# Patient Record
Sex: Male | Born: 1990 | Race: Black or African American | Hispanic: No | Marital: Married | Smoking: Former smoker
Health system: Southern US, Community
[De-identification: ages and names within clinical notes are randomized; demographics above are authoritative.]

---

## 2012-09-03 ENCOUNTER — Encounter (HOSPITAL_COMMUNITY): Payer: Self-pay

## 2012-09-03 ENCOUNTER — Emergency Department (INDEPENDENT_AMBULATORY_CARE_PROVIDER_SITE_OTHER)
Admission: EM | Admit: 2012-09-03 | Discharge: 2012-09-03 | Disposition: A | Discharge status: Home / Self Care | Payer: Medicaid Other | Source: Home / Self Care | Attending: Family Medicine | Admitting: Family Medicine

## 2012-09-03 DIAGNOSIS — H109 Unspecified conjunctivitis: Secondary | ICD-10-CM

## 2012-09-03 MED ORDER — TOBRAMYCIN 0.3 % OP SOLN
2.0000 [drp] | Freq: Four times a day (QID) | OPHTHALMIC | Status: DC
  Administered 2012-09-03 (×2): 2 [drp] via OPHTHALMIC

## 2012-09-03 MED ORDER — TOBRAMYCIN 0.3 % OP SOLN
OPHTHALMIC | Status: AC
  Filled 2012-09-03: qty 5

## 2012-09-03 NOTE — ED Notes (Signed)
Patient has 3 day duration of temporal area HA

## 2012-09-03 NOTE — ED Provider Notes (Signed)
   History    CSN: [id number] Arrival date & time 09/03/12  1514  First MD Initiated Contact with Patient 09/03/12 1635     Chief Complaint  Patient presents with  . Headache   (Consider location/radiation/quality/duration/timing/severity/associated sxs/prior Treatment) Patient is a 22 y.o. male presenting with conjunctivitis. The history is provided by the patient. The history is limited by a language barrier. A language interpreter was used.  Conjunctivitis This is a new problem. The current episode started more than 2 days ago.   History reviewed. No pertinent past medical history. History reviewed. No pertinent past surgical history. History reviewed. No pertinent family history. History  Substance Use Topics  . Smoking status: Not on file  . Smokeless tobacco: Not on file  . Alcohol Use: Not on file    Review of Systems  Constitutional: Negative.   HENT: Negative.   Eyes: Positive for redness and itching. Negative for photophobia.    Allergies  Review of patient's allergies indicates not on file.  Home Medications  No current outpatient prescriptions on file. BP 125/76  Pulse 63  Temp(Src) 98.1 F (36.7 C) (Oral)  Resp 10  SpO2 100% Physical Exam  Nursing note and vitals reviewed. Constitutional: He is oriented to person, place, and time. He appears well-developed and well-nourished.  HENT:  Head: Normocephalic.  Right Ear: External ear normal.  Left Ear: External ear normal.  Nose: Nose normal.  Mouth/Throat: Oropharynx is clear and moist.  Eyes: Conjunctivae and EOM are normal. Pupils are equal, round, and reactive to light.  Neck: Normal range of motion. Neck supple.  Lymphadenopathy:    He has no cervical adenopathy.  Neurological: He is alert and oriented to person, place, and time.  Skin: Skin is warm and dry.    ED Course  Procedures (including critical care time) Labs Reviewed - No data to display No results found. 1. Conjunctivitis of left  eye     MDM    Linna Hoff, MD 09/03/12 1659

## 2012-09-06 ENCOUNTER — Emergency Department (INDEPENDENT_AMBULATORY_CARE_PROVIDER_SITE_OTHER)
Admission: EM | Admit: 2012-09-06 | Discharge: 2012-09-06 | Disposition: A | Discharge status: Home / Self Care | Payer: Medicaid Other | Source: Home / Self Care

## 2012-09-06 ENCOUNTER — Encounter (HOSPITAL_COMMUNITY): Payer: Self-pay | Admitting: Emergency Medicine

## 2012-09-06 DIAGNOSIS — S0502XD Injury of conjunctiva and corneal abrasion without foreign body, left eye, subsequent encounter: Secondary | ICD-10-CM

## 2012-09-06 DIAGNOSIS — A499 Bacterial infection, unspecified: Secondary | ICD-10-CM

## 2012-09-06 DIAGNOSIS — H109 Unspecified conjunctivitis: Secondary | ICD-10-CM

## 2012-09-06 DIAGNOSIS — H1089 Other conjunctivitis: Secondary | ICD-10-CM

## 2012-09-06 DIAGNOSIS — Z5189 Encounter for other specified aftercare: Secondary | ICD-10-CM

## 2012-09-06 MED ORDER — POLYMYXIN B-TRIMETHOPRIM 10000-0.1 UNIT/ML-% OP SOLN: 2.0000 [drp] | OPHTHALMIC | Status: DC

## 2012-09-06 MED ORDER — AMOXICILLIN-POT CLAVULANATE 875-125 MG PO TABS: 1.0000 | ORAL_TABLET | Freq: Two times a day (BID) | ORAL | Status: DC

## 2012-09-06 MED ORDER — TETRACAINE HCL 0.5 % OP SOLN
OPHTHALMIC | Status: AC
  Filled 2012-09-06: qty 2

## 2012-09-06 NOTE — ED Provider Notes (Signed)
Medical screening examination/treatment/procedure(s) were performed by a resident physician or non-physician practitioner and as the supervising physician I was immediately available for consultation/collaboration.  Quinterious Walraven, MD   Nash Bolls S Mayme Profeta, MD 09/06/12 1752 

## 2012-09-06 NOTE — ED Notes (Signed)
Patient reports being seen 09/03/2012, diagnosed with conjunctivitis, prescribed eye drops.  Reports he he is no better and is worse.

## 2012-09-06 NOTE — ED Notes (Signed)
Eye box and tetracaine at bedside 

## 2012-09-06 NOTE — ED Provider Notes (Signed)
   History    CSN: [id number] Arrival date & time 09/06/12  1124  First MD Initiated Contact with Patient 09/06/12 1156     Chief Complaint  Patient presents with  . Conjunctivitis   (Consider location/radiation/quality/duration/timing/severity/associated sxs/prior Treatment) HPI Comments: 22 year old non-English-speaking immigrant denies states was seen here in the urgent care Monday for left eye redness. He was treated with TobraDex drops. He said his left skin worse and becoming more painful having more drainage and swelling and redness.  History reviewed. No pertinent past medical history. History reviewed. No pertinent past surgical history. No family history on file. History  Substance Use Topics  . Smoking status: Current Every Day Smoker  . Smokeless tobacco: Not on file  . Alcohol Use: Yes    Review of Systems  Eyes: Positive for pain, discharge, redness and visual disturbance.  All other systems reviewed and are negative.    Allergies  Review of patient's allergies indicates no known allergies.  Home Medications   Current Outpatient Rx  Name  Route  Sig  Dispense  Refill  . amoxicillin-clavulanate (AUGMENTIN) 875-125 MG per tablet   Oral   Take 1 tablet by mouth every 12 (twelve) hours.   14 tablet   0   . trimethoprim-polymyxin b (POLYTRIM) ophthalmic solution   Left Eye   Place 2 drops into the left eye every 4 (four) hours.   10 mL   0    BP 127/82  Pulse 62  Temp(Src) 98.2 F (36.8 C) (Oral)  Resp 14  SpO2 100% Physical Exam  Nursing note and vitals reviewed. Constitutional: He is oriented to person, place, and time. He appears well-developed and well-nourished. No distress.  HENT:  Right Ear: External ear normal.  Left Ear: External ear normal.  Eyes: EOM are normal. Pupils are equal, round, and reactive to light.  The conjunctiva of the lower lid with erythema swelling. Sclera injected.  Tetracaine drops placed the left eye followed by  forcing stain. There is slight uptake over the mid cornea and lower mid sclera. I been irrigated with 3 ounces of mouthwash.  Neck: Normal range of motion. Neck supple.  Pulmonary/Chest: Effort normal.  Lymphadenopathy:    He has no cervical adenopathy.  Neurological: He is alert and oriented to person, place, and time.  Skin: Skin is warm and dry.  Psychiatric: He has a normal mood and affect.    ED Course  Procedures (including critical care time) Labs Reviewed - No data to display No results found. 1. Bacterial conjunctivitis of left eye   2. Corneal abrasion, left, subsequent encounter     MDM   stop the Tobrex Start Polytrim 2 drops to left eye 4 times a day Augmentin 875 twice a day Probably above ophthalmologist for an appointment to see in 2 days. 3 new symptoms problems or worsening may return.  Hayden Rasmussen, NP 09/06/12 1303

## 2012-09-12 HISTORY — PX: APPENDECTOMY: SHX54

## 2012-09-13 ENCOUNTER — Observation Stay (HOSPITAL_COMMUNITY)
Admission: EM | Admit: 2012-09-13 | Discharge: 2012-09-14 | Disposition: A | Discharge status: Home / Self Care | Payer: Medicaid Other | Attending: General Surgery | Admitting: General Surgery

## 2012-09-13 ENCOUNTER — Encounter (HOSPITAL_COMMUNITY): Payer: Self-pay | Admitting: Anesthesiology

## 2012-09-13 ENCOUNTER — Emergency Department (HOSPITAL_COMMUNITY): Payer: Medicaid Other

## 2012-09-13 ENCOUNTER — Observation Stay (HOSPITAL_COMMUNITY): Payer: Medicaid Other | Admitting: Anesthesiology

## 2012-09-13 ENCOUNTER — Encounter (HOSPITAL_COMMUNITY)
Admission: EM | Disposition: A | Discharge status: Home / Self Care | Payer: Self-pay | Source: Home / Self Care | Attending: Emergency Medicine

## 2012-09-13 ENCOUNTER — Encounter (HOSPITAL_COMMUNITY): Payer: Self-pay | Admitting: Emergency Medicine

## 2012-09-13 DIAGNOSIS — K37 Unspecified appendicitis: Secondary | ICD-10-CM

## 2012-09-13 DIAGNOSIS — K3580 Unspecified acute appendicitis: Secondary | ICD-10-CM

## 2012-09-13 DIAGNOSIS — R1031 Right lower quadrant pain: Secondary | ICD-10-CM | POA: Insufficient documentation

## 2012-09-13 HISTORY — PX: LAPAROSCOPIC APPENDECTOMY: SHX408

## 2012-09-13 LAB — URINALYSIS, ROUTINE W REFLEX MICROSCOPIC
Glucose, UA: NEGATIVE mg/dL
Hgb urine dipstick: NEGATIVE
Protein, ur: 30 mg/dL — AB
pH: 7.5 (ref 5.0–8.0)

## 2012-09-13 LAB — COMPREHENSIVE METABOLIC PANEL
ALT: 33 U/L (ref 0–53)
AST: 22 U/L (ref 0–37)
Albumin: 4.3 g/dL (ref 3.5–5.2)
CO2: 29 mEq/L (ref 19–32)
Calcium: 9.6 mg/dL (ref 8.4–10.5)
Chloride: 99 mEq/L (ref 96–112)
GFR calc non Af Amer: >90 mL/min (ref >90)
Sodium: 136 mEq/L (ref 135–145)

## 2012-09-13 LAB — URINE MICROSCOPIC-ADD ON

## 2012-09-13 LAB — CBC WITH DIFFERENTIAL/PLATELET
Basophils Absolute: 0 10*3/uL (ref 0.0–0.1)
Eosinophils Relative: 2 % (ref 0–5)
Lymphocytes Relative: 9 % — ABNORMAL LOW (ref 12–46)
Neutro Abs: 10.3 10*3/uL — ABNORMAL HIGH (ref 1.7–7.7)
Platelets: 134 10*3/uL — ABNORMAL LOW (ref 150–400)
RDW: 12.2 % (ref 11.5–15.5)
WBC: 12.6 10*3/uL — ABNORMAL HIGH (ref 4.0–10.5)

## 2012-09-13 SURGERY — APPENDECTOMY, LAPAROSCOPIC
Anesthesia: General | Site: Abdomen | Wound class: Contaminated

## 2012-09-13 MED ORDER — PROPOFOL 10 MG/ML IV BOLUS
INTRAVENOUS | Status: DC | PRN
  Administered 2012-09-13: 150 mg via INTRAVENOUS

## 2012-09-13 MED ORDER — HYDROCODONE-ACETAMINOPHEN 5-325 MG PO TABS
1.0000 | ORAL_TABLET | ORAL | Status: DC | PRN
  Administered 2012-09-13 (×2): 1 via ORAL
  Administered 2012-09-14: 2 via ORAL
  Filled 2012-09-13 (×2): qty 2

## 2012-09-13 MED ORDER — BUPIVACAINE-EPINEPHRINE 0.25% -1:200000 IJ SOLN
INTRAMUSCULAR | Status: DC | PRN
  Administered 2012-09-13: 10 mL

## 2012-09-13 MED ORDER — TOBRAMYCIN 0.3 % OP SOLN
1.0000 [drp] | OPHTHALMIC | Status: DC
  Administered 2012-09-13 – 2012-09-14 (×4): 1 [drp] via OPHTHALMIC
  Filled 2012-09-13: qty 5

## 2012-09-13 MED ORDER — NEOSTIGMINE METHYLSULFATE 1 MG/ML IJ SOLN
INTRAMUSCULAR | Status: DC | PRN
  Administered 2012-09-13: 1 mg via INTRAVENOUS
  Administered 2012-09-13: 3 mg via INTRAVENOUS

## 2012-09-13 MED ORDER — HYDROMORPHONE HCL PF 1 MG/ML IJ SOLN
0.2500 mg | INTRAMUSCULAR | Status: DC | PRN
  Administered 2012-09-13: 0.5 mg via INTRAVENOUS
  Filled 2012-09-13: qty 1

## 2012-09-13 MED ORDER — MORPHINE SULFATE 2 MG/ML IJ SOLN
2.0000 mg | INTRAMUSCULAR | Status: DC | PRN
  Filled 2012-09-13: qty 1

## 2012-09-13 MED ORDER — IOHEXOL 300 MG/ML  SOLN
25.0000 mL | INTRAMUSCULAR | Status: AC
  Administered 2012-09-13: 25 mL via ORAL

## 2012-09-13 MED ORDER — POLYMYXIN B-TRIMETHOPRIM 10000-0.1 UNIT/ML-% OP SOLN
2.0000 [drp] | OPHTHALMIC | Status: DC
  Administered 2012-09-13 – 2012-09-14 (×4): 2 [drp] via OPHTHALMIC
  Filled 2012-09-13: qty 10

## 2012-09-13 MED ORDER — 0.9 % SODIUM CHLORIDE (POUR BTL) OPTIME
TOPICAL | Status: DC | PRN
  Administered 2012-09-13: 1000 mL

## 2012-09-13 MED ORDER — IOHEXOL 300 MG/ML  SOLN
100.0000 mL | Freq: Once | INTRAMUSCULAR | Status: AC | PRN
  Administered 2012-09-13: 100 mL via INTRAVENOUS

## 2012-09-13 MED ORDER — ONDANSETRON HCL 4 MG/2ML IJ SOLN: 4.0000 mg | Freq: Once | INTRAMUSCULAR | Status: AC | PRN

## 2012-09-13 MED ORDER — KETOROLAC TROMETHAMINE 30 MG/ML IJ SOLN: 15.0000 mg | Freq: Once | INTRAMUSCULAR | Status: AC | PRN

## 2012-09-13 MED ORDER — GLYCOPYRROLATE 0.2 MG/ML IJ SOLN
INTRAMUSCULAR | Status: DC | PRN
  Administered 2012-09-13: 0.2 mg via INTRAVENOUS
  Administered 2012-09-13: 0.4 mg via INTRAVENOUS

## 2012-09-13 MED ORDER — MIDAZOLAM HCL 5 MG/5ML IJ SOLN
INTRAMUSCULAR | Status: DC | PRN
  Administered 2012-09-13: 2 mg via INTRAVENOUS

## 2012-09-13 MED ORDER — ONDANSETRON HCL 4 MG/2ML IJ SOLN
INTRAMUSCULAR | Status: DC | PRN
  Administered 2012-09-13: 4 mg via INTRAVENOUS

## 2012-09-13 MED ORDER — CHLORHEXIDINE GLUCONATE CLOTH 2 % EX PADS
6.0000 | MEDICATED_PAD | Freq: Every day | CUTANEOUS | Status: DC
  Administered 2012-09-14: 6 via TOPICAL

## 2012-09-13 MED ORDER — LACTATED RINGERS IV SOLN
INTRAVENOUS | Status: DC | PRN
  Administered 2012-09-13: 11:00:00 via INTRAVENOUS

## 2012-09-13 MED ORDER — POLYMYXIN B-TRIMETHOPRIM 10000-0.1 UNIT/ML-% OP SOLN: 2.0000 [drp] | OPHTHALMIC | Status: DC

## 2012-09-13 MED ORDER — LIDOCAINE HCL (CARDIAC) 20 MG/ML IV SOLN
INTRAVENOUS | Status: DC | PRN
  Administered 2012-09-13: 30 mg via INTRAVENOUS

## 2012-09-13 MED ORDER — ONDANSETRON HCL 4 MG/2ML IJ SOLN
4.0000 mg | Freq: Once | INTRAMUSCULAR | Status: AC
  Administered 2012-09-13: 4 mg via INTRAVENOUS
  Filled 2012-09-13: qty 2

## 2012-09-13 MED ORDER — KCL IN DEXTROSE-NACL 20-5-0.45 MEQ/L-%-% IV SOLN
INTRAVENOUS | Status: DC
  Administered 2012-09-13: 10:00:00 via INTRAVENOUS
  Filled 2012-09-13 (×7): qty 1000

## 2012-09-13 MED ORDER — SODIUM CHLORIDE 0.9 % IV SOLN
1.0000 g | INTRAVENOUS | Status: AC
  Administered 2012-09-13: 1 g via INTRAVENOUS
  Filled 2012-09-13: qty 1

## 2012-09-13 MED ORDER — HYDROMORPHONE HCL PF 1 MG/ML IJ SOLN
1.0000 mg | Freq: Once | INTRAMUSCULAR | Status: AC
  Administered 2012-09-13: 1 mg via INTRAVENOUS
  Filled 2012-09-13: qty 1

## 2012-09-13 MED ORDER — ROCURONIUM BROMIDE 100 MG/10ML IV SOLN
INTRAVENOUS | Status: DC | PRN
  Administered 2012-09-13: 40 mg via INTRAVENOUS

## 2012-09-13 MED ORDER — FENTANYL CITRATE 0.05 MG/ML IJ SOLN
INTRAMUSCULAR | Status: DC | PRN
  Administered 2012-09-13: 50 ug via INTRAVENOUS

## 2012-09-13 MED ORDER — SODIUM CHLORIDE 0.9 % IR SOLN
Status: DC | PRN
  Administered 2012-09-13: 1000 mL

## 2012-09-13 MED ORDER — ACETAMINOPHEN 325 MG PO TABS: 650.0000 mg | ORAL_TABLET | Freq: Four times a day (QID) | ORAL | Status: DC | PRN

## 2012-09-13 MED ORDER — ACETAMINOPHEN 650 MG RE SUPP: 650.0000 mg | Freq: Four times a day (QID) | RECTAL | Status: DC | PRN

## 2012-09-13 MED ORDER — BUPIVACAINE-EPINEPHRINE PF 0.25-1:200000 % IJ SOLN
INTRAMUSCULAR | Status: AC
  Filled 2012-09-13: qty 30

## 2012-09-13 MED ORDER — ENOXAPARIN SODIUM 40 MG/0.4ML ~~LOC~~ SOLN
40.0000 mg | SUBCUTANEOUS | Status: DC
  Administered 2012-09-13: 40 mg via SUBCUTANEOUS
  Filled 2012-09-13 (×3): qty 0.4

## 2012-09-13 MED ORDER — BACITRACIN ZINC 500 UNIT/GM EX OINT
TOPICAL_OINTMENT | Freq: Two times a day (BID) | CUTANEOUS | Status: DC
  Administered 2012-09-13 – 2012-09-14 (×2): 15.5556 via TOPICAL
  Filled 2012-09-13: qty 15
  Filled 2012-09-13: qty 28.35

## 2012-09-13 SURGICAL SUPPLY — 47 items
APPLIER CLIP ROT 10 11.4 M/L (STAPLE)
BLADE SURG ROTATE 9660 (MISCELLANEOUS) ×2 IMPLANT
CANISTER SUCTION 2500CC (MISCELLANEOUS) ×2 IMPLANT
CHLORAPREP W/TINT 26ML (MISCELLANEOUS) ×2 IMPLANT
CLIP APPLIE ROT 10 11.4 M/L (STAPLE) IMPLANT
CLOTH BEACON ORANGE TIMEOUT ST (SAFETY) ×2 IMPLANT
COVER SURGICAL LIGHT HANDLE (MISCELLANEOUS) ×2 IMPLANT
CUTTER LINEAR ENDO 35 ETS (STAPLE) IMPLANT
CUTTER LINEAR ENDO 35 ETS TH (STAPLE) ×2 IMPLANT
DECANTER SPIKE VIAL GLASS SM (MISCELLANEOUS) IMPLANT
DERMABOND ADVANCED (GAUZE/BANDAGES/DRESSINGS) ×1
DERMABOND ADVANCED .7 DNX12 (GAUZE/BANDAGES/DRESSINGS) ×1 IMPLANT
DRAPE UTILITY 15X26 W/TAPE STR (DRAPE) ×4 IMPLANT
ELECT REM PT RETURN 9FT ADLT (ELECTROSURGICAL) ×2
ELECTRODE REM PT RTRN 9FT ADLT (ELECTROSURGICAL) ×1 IMPLANT
ENDOLOOP SUT PDS II  0 18 (SUTURE)
ENDOLOOP SUT PDS II 0 18 (SUTURE) IMPLANT
GLOVE BIO SURGEON STRL SZ8 (GLOVE) ×2 IMPLANT
GLOVE BIOGEL PI IND STRL 7.0 (GLOVE) ×3 IMPLANT
GLOVE BIOGEL PI IND STRL 8 (GLOVE) ×1 IMPLANT
GLOVE BIOGEL PI INDICATOR 7.0 (GLOVE) ×3
GLOVE BIOGEL PI INDICATOR 8 (GLOVE) ×1
GLOVE SURG SS PI 7.0 STRL IVOR (GLOVE) ×6 IMPLANT
GOWN STRL NON-REIN LRG LVL3 (GOWN DISPOSABLE) ×6 IMPLANT
GOWN STRL REIN XL XLG (GOWN DISPOSABLE) ×2 IMPLANT
KIT BASIN OR (CUSTOM PROCEDURE TRAY) ×2 IMPLANT
KIT ROOM TURNOVER OR (KITS) ×2 IMPLANT
NEEDLE 22X1 1/2 (OR ONLY) (NEEDLE) ×2 IMPLANT
NS IRRIG 1000ML POUR BTL (IV SOLUTION) ×2 IMPLANT
PAD ARMBOARD 7.5X6 YLW CONV (MISCELLANEOUS) ×4 IMPLANT
POUCH SPECIMEN RETRIEVAL 10MM (ENDOMECHANICALS) ×2 IMPLANT
RELOAD /EVU35 (ENDOMECHANICALS) IMPLANT
RELOAD CUTTER ETS 35MM STAND (ENDOMECHANICALS) IMPLANT
SCALPEL HARMONIC ACE (MISCELLANEOUS) ×2 IMPLANT
SET IRRIG TUBING LAPAROSCOPIC (IRRIGATION / IRRIGATOR) ×2 IMPLANT
SPECIMEN JAR SMALL (MISCELLANEOUS) ×2 IMPLANT
SUT VIC AB 4-0 PS2 27 (SUTURE) ×2 IMPLANT
SWAB COLLECTION DEVICE MRSA (MISCELLANEOUS) IMPLANT
TOWEL OR 17X24 6PK STRL BLUE (TOWEL DISPOSABLE) ×2 IMPLANT
TOWEL OR 17X26 10 PK STRL BLUE (TOWEL DISPOSABLE) ×2 IMPLANT
TRAY FOLEY CATH 16FR SILVER (SET/KITS/TRAYS/PACK) ×2 IMPLANT
TRAY LAPAROSCOPIC (CUSTOM PROCEDURE TRAY) ×2 IMPLANT
TROCAR XCEL 12X100 BLDLESS (ENDOMECHANICALS) ×2 IMPLANT
TROCAR XCEL BLUNT TIP 100MML (ENDOMECHANICALS) ×2 IMPLANT
TROCAR XCEL NON-BLD 5MMX100MML (ENDOMECHANICALS) ×2 IMPLANT
TUBE ANAEROBIC SPECIMEN COL (MISCELLANEOUS) IMPLANT
WATER STERILE IRR 1000ML POUR (IV SOLUTION) IMPLANT

## 2012-09-13 NOTE — H&P (Signed)
Early acute appendicitis - will take to OR for lap appy. Procedure, risks, and benefits D/W him and he agrees.  IV Invanz. Patient examined and I agree with the assessment and plan  Violeta Gelinas, MD, MPH, FACS Pager: [phone]  09/13/2012 8:31 AM

## 2012-09-13 NOTE — ED Notes (Addendum)
PT. REPORTS MID ABDOMINAL PAIN WITH DIARRHEA ONSET YESTERDAY AFTERNOON , DENIES NAUSEA OR VOMITTING , NO FEVER OR CHILLS. SLIGHT HEADACHE. FRIEND TRANSLATING Sean Ingram) FOR PT.

## 2012-09-13 NOTE — Transfer of Care (Signed)
Immediate Anesthesia Transfer of Care Note  Patient: Sean Ingram  Procedure(s) Performed: Procedure(s): APPENDECTOMY LAPAROSCOPIC (N/A)  Patient Location: PACU  Anesthesia Type:General  Level of Consciousness: awake, alert  and oriented  Airway & Oxygen Therapy: Patient Spontanous Breathing and Patient connected to nasal cannula oxygen  Post-op Assessment: Report given to PACU RN, Post -op Vital signs reviewed and stable and Patient moving all extremities X 4  Post vital signs: Reviewed and stable  Complications: No apparent anesthesia complications

## 2012-09-13 NOTE — Anesthesia Procedure Notes (Signed)
Procedure Name: Intubation Date/Time: 09/13/2012 11:52 AM Performed by: Quentin Ore Pre-anesthesia Checklist: Patient identified, Emergency Drugs available, Suction available, Patient being monitored and Timeout performed Patient Re-evaluated:Patient Re-evaluated prior to inductionOxygen Delivery Method: Circle system utilized Preoxygenation: Pre-oxygenation with 100% oxygen Intubation Type: IV induction and Cricoid Pressure applied Ventilation: Mask ventilation without difficulty Laryngoscope Size: Mac and 4 Grade View: Grade I Tube type: Oral Tube size: 7.5 mm Number of attempts: 1 Airway Equipment and Method: Stylet and LTA kit utilized Placement Confirmation: ETT inserted through vocal cords under direct vision,  positive ETCO2 and breath sounds checked- equal and bilateral Secured at: 21 cm Tube secured with: Tape Dental Injury: Teeth and Oropharynx as per pre-operative assessment

## 2012-09-13 NOTE — H&P (Signed)
Chief Complaint: abdominal pain HPI: Sean Ingram is a healthy African male who presented to [facility] ED with abdominal pain which suddenly started yesterday at 5PM after eating eggs and potatoes.  Location is generalized.  Associated symptoms; chills.  Coarse is unchanged.  Moderate in severity.  Characterized as "cutting" pain.  No aggravating or alleviating factors. No modifying factors.  Denies fever or sweats.  Denies nausea or vomiting. Denies previous surgical procedures.  At present time pain is 3/10 after 2 doses of dilaudid and 1 dose of morphine.  He is from [place], moved here 3 weeks ago.  He lives with a roommate who is serving as a Nurse, learning disability.  Mr. Haymer declined to have an Arabic interpreter via telephone.  History reviewed. No pertinent past medical history.  History reviewed. No pertinent past surgical history.  History reviewed. No pertinent family history. Social History:  reports that he has been smoking.  He does not have any smokeless tobacco history on file. He reports that  drinks alcohol. He reports that he does not use illicit drugs.  Allergies: No Known Allergies   (Not in a hospital admission)  Results for orders placed during the hospital encounter of 09/13/12 (from the past 48 hour(s))  CBC WITH DIFFERENTIAL     Status: Abnormal   Collection Time    09/13/12  1:07 AM      Result Value Range   WBC 12.6 (*) 4.0 - 10.5 K/uL   RBC 4.89  4.22 - 5.81 MIL/uL   Hemoglobin 15.3  13.0 - 17.0 g/dL   HCT 96.0  45.4 - 09.8 %   MCV 84.9  78.0 - 100.0 fL   MCH 31.3  26.0 - 34.0 pg   MCHC 36.9 (*) 30.0 - 36.0 g/dL   RDW 11.9  14.7 - 82.9 %   Platelets 134 (*) 150 - 400 K/uL   Neutrophils Relative % 82 (*) 43 - 77 %   Neutro Abs 10.3 (*) 1.7 - 7.7 K/uL   Lymphocytes Relative 9 (*) 12 - 46 %   Lymphs Abs 1.1  0.7 - 4.0 K/uL   Monocytes Relative 7  3 - 12 %   Monocytes Absolute 0.9  0.1 - 1.0 K/uL   Eosinophils Relative 2  0 - 5 %   Eosinophils Absolute 0.2  0.0 -  0.7 K/uL   Basophils Relative 0  0 - 1 %   Basophils Absolute 0.0  0.0 - 0.1 K/uL  COMPREHENSIVE METABOLIC PANEL     Status: Abnormal   Collection Time    09/13/12  1:07 AM      Result Value Range   Sodium 136  135 - 145 mEq/L   Potassium 3.5  3.5 - 5.1 mEq/L   Chloride 99  96 - 112 mEq/L   CO2 29  19 - 32 mEq/L   Glucose, Bld 108 (*) 70 - 99 mg/dL   BUN 12  6 - 23 mg/dL   Creatinine, Ser 5.62  0.50 - 1.35 mg/dL   Calcium 9.6  8.4 - 13.0 mg/dL   Total Protein 7.2  6.0 - 8.3 g/dL   Albumin 4.3  3.5 - 5.2 g/dL   AST 22  0 - 37 U/L   ALT 33  0 - 53 U/L   Alkaline Phosphatase 56  39 - 117 U/L   Total Bilirubin 0.7  0.3 - 1.2 mg/dL   GFR calc non Af Amer >90  >90 mL/min   GFR calc  Af Amer >90  >90 mL/min   Comment:            The eGFR has been calculated     using the CKD EPI equation.     This calculation has not been     validated in all clinical     situations.     eGFR's persistently     <90 mL/min signify     possible Chronic Kidney Disease.  LIPASE, BLOOD     Status: None   Collection Time    09/13/12  1:07 AM      Result Value Range   Lipase 12  11 - 59 U/L  URINALYSIS, ROUTINE W REFLEX MICROSCOPIC     Status: Abnormal   Collection Time    09/13/12  1:11 AM      Result Value Range   Color, Urine YELLOW  YELLOW   APPearance CLEAR  CLEAR   Specific Gravity, Urine 1.015  1.005 - 1.030   pH 7.5  5.0 - 8.0   Glucose, UA NEGATIVE  NEGATIVE mg/dL   Hgb urine dipstick NEGATIVE  NEGATIVE   Bilirubin Urine NEGATIVE  NEGATIVE   Ketones, ur NEGATIVE  NEGATIVE mg/dL   Protein, ur 30 (*) NEGATIVE mg/dL   Urobilinogen, UA 0.2  0.0 - 1.0 mg/dL   Nitrite NEGATIVE  NEGATIVE   Leukocytes, UA NEGATIVE  NEGATIVE  URINE MICROSCOPIC-ADD ON     Status: None   Collection Time    09/13/12  1:11 AM      Result Value Range   WBC, UA 0-2  <3 WBC/hpf   RBC / HPF 3-6  <3 RBC/hpf   Bacteria, UA RARE  RARE   Sperm, UA PRESENT     Ct Abdomen Pelvis W Contrast  09/13/2012   *RADIOLOGY  REPORT*  Clinical Data: Right lower quadrant abdominal pain.  CT ABDOMEN AND PELVIS WITH CONTRAST  Technique:  Multidetector CT imaging of the abdomen and pelvis was performed following the standard protocol during bolus administration of intravenous contrast.  Contrast: OMNIPAQUE IOHEXOL 300 MG/ML  SOLN  Comparison: None.  Findings:  BODY WALL: Unremarkable.  LOWER CHEST:  Mediastinum: Unremarkable.  Lungs/pleura: No consolidation.  ABDOMEN/PELVIS:  Liver: No focal abnormality.  Biliary: No evidence of biliary obstruction or stone.  Pancreas: Unremarkable.  Spleen: Unremarkable.  Adrenals: Unremarkable.  Kidneys and ureters: No hydronephrosis or stone.  Bladder: Unremarkable.  Bowel:  The appendix is difficult to visualize, but is evident on reformats - extending medially and posteriorly from the cecum.  The structure measures up to 8mm in diameter.  No periappendiceal inflammatory changes.  Retroperitoneum: No mass or adenopathy.  Peritoneum: No free fluid or gas.  Reproductive: 12 mm posterior midline prostate cyst.  Vascular: No acute abnormality.  OSSEOUS: No acute abnormalities. No suspicious lytic or blastic lesions.  These results were called by telephone on 09/13/2012 at 06:00 a.m. to Dr Lynetta Mare, who verbally acknowledged these results.  IMPRESSION:  1.  Mildly dilated appendix (8 mm) which can be an early sign of acute appendicitis.  No confirmatory inflammatory changes. Followup imaging may be useful if there is clinical uncertainty or clinical worsening. 2.  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   Original Report Authenticated By: Tiburcio Pea    Review of Systems  Constitutional: Positive for chills. Negative for fever, weight loss, malaise/fatigue and diaphoresis.  Eyes: Negative for blurred vision, double vision and photophobia.  Respiratory: Negative for shortness of breath and wheezing.  Cardiovascular: Negative for chest pain, palpitations and leg swelling.   Gastrointestinal: Positive for abdominal pain. Negative for heartburn, nausea, vomiting, diarrhea, constipation, blood in stool and melena.  Genitourinary: Negative for dysuria, urgency and hematuria.  Neurological: Positive for headaches. Negative for dizziness, tingling, sensory change, speech change, focal weakness, seizures, loss of consciousness and weakness.    Blood pressure 110/62, pulse 64, temperature 98.7 F (37.1 C), temperature source Oral, resp. rate 16, SpO2 100.00%. Physical Exam  Constitutional: He is oriented to person, place, and time. He appears well-developed and well-nourished. No distress.  HENT:  Head: Normocephalic and atraumatic.  Mouth/Throat: No oropharyngeal exudate.  Eyes: Conjunctivae and EOM are normal. Pupils are equal, round, and reactive to light. Right eye exhibits no discharge. Left eye exhibits no discharge. No scleral icterus.  Neck: Neck supple. No thyromegaly present.  Cardiovascular: Normal rate, regular rhythm, normal heart sounds and intact distal pulses.  Exam reveals no gallop and no friction rub.   No murmur heard. Respiratory: Effort normal and breath sounds normal. No respiratory distress. He has no wheezes. He has no rales. He exhibits no tenderness.  GI: Soft. He exhibits no distension and no mass. There is tenderness. There is no rebound and no guarding.  TTP RLQ and LLQ  Musculoskeletal: He exhibits no edema and no tenderness.  Lymphadenopathy:    He has no cervical adenopathy.  Neurological: He is alert and oriented to person, place, and time.  Skin: Skin is warm and dry. No rash noted. He is not diaphoretic. No erythema. No pallor.  Psychiatric: He has a normal mood and affect. His behavior is normal. Judgment and thought content normal.     Assessment/Plan Acute Appendicitis -admit to observation -obtain consent for a laparoscopic appendectomy today. Risks of the surgery discussed with the patient including but not limited to  bleeding, infection and death.  He verbalized understanding.   -NPO -invanz on call to OR -IVF  -SCDs -pain control -antiemetics  VTE prophylaxis -lovenox following surgery -ambulate  Bonner Puna [facility] ANP-BC Pager [phone] 09/13/2012, 7:53 AM

## 2012-09-13 NOTE — ED Notes (Signed)
Pt c/o headache and abdominal pain generalized around the umbilicus.  Pt denies N/V/D.

## 2012-09-13 NOTE — Op Note (Signed)
09/13/2012  12:25 PM  PATIENT:  Sean Ingram  22 y.o. male  PRE-OPERATIVE DIAGNOSIS:  ACUTE APPENDICITIS  POST-OPERATIVE DIAGNOSIS:  ACUTE APPENDICITIS  PROCEDURE:  Procedure(s): APPENDECTOMY LAPAROSCOPIC  SURGEON:  Surgeon(s): Liz Malady, MD  PHYSICIAN ASSISTANT:   ASSISTANTS: none   ANESTHESIA:   local and general  EBL:  Total I/O In: -  Out: 200 [Urine:200]  BLOOD ADMINISTERED:none  DRAINS: none   SPECIMEN:  Excision  DISPOSITION OF SPECIMEN:  PATHOLOGY  COUNTS:  YES  DICTATION: .Dragon Dictation  Patient presented to the ED with abdominal pain.Work-up demonstrated acute appendicitis. His Is brought for a laparoscopic appendectomy. He received intravenous antibiotics. Informed consent was obtained. Patient was brought to the operating room and general endotracheal anesthesia was administered by the anesthesia staff. Abdomen was prepped and draped in sterile fashion after Foley catheter was placed by nursing. Time out procedure was performed. Infraumbilical region was infiltrated with quarter percent Marcaine with epinephrine. An umbilical incision was made. Subcutaneous tissues were dissected down revealing the anterior fascia. This was divided sharply along the midline. There was entered under direct vision. 0 Vicryl pursestring suture was placed Around the fascial opening.Hassan trocar was inserted. Abdomen was insufflated with carbon dioxide in standard fashion. Under direct vision, a 12 mm left lower quadrant port and a 5 mm right abdominal port were placed. Local was used at each port site. Laparoscopic exploration revealed an inflamed but not perforated appendix consistent with acute appendicitis. The mesoappendix was divided with the harmonic scalpel achieving excellent hemostasis. The base of the appendix was intact. It was then divided with Endo GIA with a vascular load. There was excellent staple line on the cecum. Appendix was placed in Endo Catch bag and  removed from the abdomen via the lower quadrant port site. Abdomen was copiously irrigated. There was no bleeding. Staple line was intact. Ports were then removed under direct vision. Pneumoperitoneum was released. Infra-umbilical fascia was closed by tying the 0 Vicryl pursestring suture with care not to trap any intra-abdominal contents. All 3 wounds were copiously irrigated and the skin of each was closed with running 4 Vicryl subcuticular stitch followed by Dermabond. All counts were correct. Patient tolerated procedure well without apparent competitions taken recovery in stable condition.  PATIENT DISPOSITION:  PACU - hemodynamically stable.   Delay start of Pharmacological VTE agent (>24hrs) due to surgical blood loss or risk of bleeding:  no  Violeta Gelinas, MD, MPH, FACS Pager: [phone]  7/31/201412:25 PM

## 2012-09-13 NOTE — Progress Notes (Signed)
UR completed 

## 2012-09-13 NOTE — ED Notes (Signed)
[facility] at bedside.

## 2012-09-13 NOTE — Progress Notes (Signed)
Report given to mark rn as caregiver 

## 2012-09-13 NOTE — ED Provider Notes (Signed)
Medical screening examination/treatment/procedure(s) were conducted as a shared visit with non-physician practitioner(s) and myself.  I personally evaluated the patient during the encounter  Right lower quadrant pain with tenderness on examination.  Equivocal CT.  General surgery to evaluate.  Likely appendicitis   Lyanne Co, MD 09/13/12 [phone]

## 2012-09-13 NOTE — ED Notes (Signed)
MD, Dr. Janee Morn at bedside.

## 2012-09-13 NOTE — Anesthesia Preprocedure Evaluation (Addendum)
Anesthesia Evaluation  Patient identified by MRN, date of birth, ID band Patient awake    Reviewed: Allergy & Precautions, H&P , NPO status , Patient's Chart, lab work & pertinent test results  Airway Mallampati: I      Dental  (+) Teeth Intact   Pulmonary Current Smoker,  breath sounds clear to auscultation        Cardiovascular Rhythm:Regular Rate:Normal     Neuro/Psych    GI/Hepatic   Endo/Other    Renal/GU      Musculoskeletal   Abdominal   Peds  Hematology   Anesthesia Other Findings   Reproductive/Obstetrics                         Anesthesia Physical Anesthesia Plan  ASA: II and emergent  Anesthesia Plan: General   Post-op Pain Management:    Induction: Intravenous  Airway Management Planned: Oral ETT  Additional Equipment:   Intra-op Plan:   Post-operative Plan: Extubation in OR  Informed Consent: I have reviewed the patients History and Physical, chart, labs and discussed the procedure including the risks, benefits and alternatives for the proposed anesthesia with the patient or authorized representative who has indicated his/her understanding and acceptance.   Dental advisory given  Plan Discussed with: CRNA and Anesthesiologist  Anesthesia Plan Comments: (Plan GA with RSI oral ETT  Kipp Brood, MD)       Anesthesia Quick Evaluation

## 2012-09-13 NOTE — ED Provider Notes (Signed)
CSN: [id number]     Arrival date & time 09/13/12  0055 History     First MD Initiated Contact with Patient 09/13/12 0114     Chief Complaint  Patient presents with  . Abdominal Pain   (Consider location/radiation/quality/duration/timing/severity/associated sxs/prior Treatment) HPI  Nijee Seid Fidalgo is a 22 y.o. male complaining of severe abdominal pain onset yesterday afternoon associated with generalized headache. Patient denies fever, nausea, vomiting, diarrhea (note that this contradicts the triage note). Rates pain at 10 out of 10, no exacerbating or alleviating factors identified. No prior surgeries.   History reviewed. No pertinent past medical history. History reviewed. No pertinent past surgical history. No family history on file. History  Substance Use Topics  . Smoking status: Current Every Day Smoker  . Smokeless tobacco: Not on file  . Alcohol Use: Yes    Review of Systems 10 systems reviewed and found to be negative, except as noted in the HPI   Allergies  Review of patient's allergies indicates no known allergies.  Home Medications   Current Outpatient Rx  Name  Route  Sig  Dispense  Refill  . amoxicillin-clavulanate (AUGMENTIN) 875-125 MG per tablet   Oral   Take 1 tablet by mouth every 12 (twelve) hours.         Marland Kitchen tobramycin (TOBREX) 0.3 % ophthalmic solution   Left Eye   Place 1 drop into the left eye every 4 (four) hours.          Marland Kitchen trimethoprim-polymyxin b (POLYTRIM) ophthalmic solution   Left Eye   Place 2 drops into the left eye every 4 (four) hours.          BP 133/78  Pulse 73  Temp(Src) 98.7 F (37.1 C) (Oral)  Resp 14  SpO2 99% Physical Exam  Nursing note and vitals reviewed. Constitutional: He is oriented to person, place, and time. He appears well-developed and well-nourished. No distress.  HENT:  Head: Normocephalic.  Mouth/Throat: Oropharynx is clear and moist.  Eyes: EOM are normal. Pupils are equal, round, and reactive  to light.  Bilateral conjunctiva are injected  Cardiovascular: Normal rate, regular rhythm and intact distal pulses.   Pulmonary/Chest: Effort normal and breath sounds normal. No stridor. No respiratory distress. He has no wheezes. He has no rales. He exhibits no tenderness.  Abdominal: Soft. Bowel sounds are normal. He exhibits no distension and no mass. There is tenderness. There is no rebound and no guarding.  Tender to palpation of McBurney's point, no guarding or rebound  Musculoskeletal: Normal range of motion.  Neurological: He is alert and oriented to person, place, and time.  Psychiatric: He has a normal mood and affect.    ED Course   Procedures (including critical care time)  Labs Reviewed  CBC WITH DIFFERENTIAL - Abnormal; Notable for the following:    WBC 12.6 (*)    MCHC 36.9 (*)    Platelets 134 (*)    Neutrophils Relative % 82 (*)    Neutro Abs 10.3 (*)    Lymphocytes Relative 9 (*)    All other components within normal limits  COMPREHENSIVE METABOLIC PANEL - Abnormal; Notable for the following:    Glucose, Bld 108 (*)    All other components within normal limits  URINALYSIS, ROUTINE W REFLEX MICROSCOPIC - Abnormal; Notable for the following:    Protein, ur 30 (*)    All other components within normal limits  LIPASE, BLOOD  URINE MICROSCOPIC-ADD ON   No results found.  3:45 patient seen and examined at the bedside, states his pain is significantly improved. Abdominal exam remains nonsurgical with tenderness to palpation the right lower quadrant. He is drinking his contrast at this time.  5:20 AM Pt requesting more pain Rx, 1mg  dilaudid given. No peritoneal signs on repeat abdominal exam  No diagnosis found.  MDM   Filed Vitals:   09/13/12 0104  BP: 133/78  Pulse: 73  Temp: 98.7 F (37.1 C)  TempSrc: Oral  Resp: 14  SpO2: 99%     Dreyson Dnaiel Voller is a 22 y.o. male severe abdominal pain, tender in the right lower quadrant high clinical concern for  appy. Pt made  n.p.o., CT abdomen pelvis pending. Patient has a mild leukocytosis.  Radiology report from Dr. Grace Isaac appreciated: He states the appendix is difficult to locate, mildly dilated.   Surgery consult consult from [facility] appreciated: they will come to evaluate the Pt. discussed results with patient and his friend who is translating, advised him to remain n.p.o. advised him that he may need surgery.  Case signed out to PA [place] at shift change.   Medications  HYDROmorphone (DILAUDID) injection 1 mg (not administered)  ondansetron (ZOFRAN) injection 4 mg (not administered)    Note: Portions of this report may have been transcribed using voice recognition software. Every effort was made to ensure accuracy; however, inadvertent computerized transcription errors may be present    Wynetta Emery, PA-C 09/13/12 [phone]

## 2012-09-13 NOTE — Anesthesia Postprocedure Evaluation (Signed)
  Anesthesia Post-op Note  Patient: Sean Ingram  Procedure(s) Performed: Procedure(s): APPENDECTOMY LAPAROSCOPIC (N/A)  Patient Location: PACU  Anesthesia Type:General  Level of Consciousness: awake, alert  and oriented  Airway and Oxygen Therapy: Patient Spontanous Breathing and Patient connected to nasal cannula oxygen  Post-op Pain: mild  Post-op Assessment: Post-op Vital signs reviewed, Patient's Cardiovascular Status Stable, Respiratory Function Stable, Patent Airway, No signs of Nausea or vomiting and Pain level controlled  Post-op Vital Signs: stable  Complications: No apparent anesthesia complications

## 2012-09-13 NOTE — Preoperative (Signed)
Beta Blockers   Reason not to administer Beta Blockers:Not Applicable 

## 2012-09-14 ENCOUNTER — Encounter (HOSPITAL_COMMUNITY): Payer: Self-pay | Admitting: General Surgery

## 2012-09-14 MED ORDER — HYDROCODONE-ACETAMINOPHEN 5-325 MG PO TABS: 1.0000 | ORAL_TABLET | ORAL | Status: AC | PRN

## 2012-09-14 NOTE — Discharge Summary (Signed)
Tiffannie Sloss, MD, MPH, FACS Pager: [phone]  

## 2012-09-14 NOTE — Progress Notes (Signed)
Patient discharge home. Home discharge instruction given utilizing phone interpreter. Patient verbalizes understanding.

## 2012-09-14 NOTE — Discharge Summary (Signed)
Physician Discharge Summary  Patient ID: Sean Ingram MRN: [id number] DOB/AGE: Oct 04, 1990 22 y.o.  Admit date: 09/13/2012 Discharge date: 09/14/2012  Admitting Diagnosis: Acute appendicitis   Discharge Diagnosis Patient Active Problem List   Diagnosis Date Noted  . Appendicitis, acute 09/13/2012    Consultants none  Imaging: Ct Abdomen Pelvis W Contrast  09/13/2012   *RADIOLOGY REPORT*  Clinical Data: Right lower quadrant abdominal pain.  CT ABDOMEN AND PELVIS WITH CONTRAST  Technique:  Multidetector CT imaging of the abdomen and pelvis was performed following the standard protocol during bolus administration of intravenous contrast.  Contrast: OMNIPAQUE IOHEXOL 300 MG/ML  SOLN  Comparison: None.  Findings:  BODY WALL: Unremarkable.  LOWER CHEST:  Mediastinum: Unremarkable.  Lungs/pleura: No consolidation.  ABDOMEN/PELVIS:  Liver: No focal abnormality.  Biliary: No evidence of biliary obstruction or stone.  Pancreas: Unremarkable.  Spleen: Unremarkable.  Adrenals: Unremarkable.  Kidneys and ureters: No hydronephrosis or stone.  Bladder: Unremarkable.  Bowel:  The appendix is difficult to visualize, but is evident on reformats - extending medially and posteriorly from the cecum.  The structure measures up to 8mm in diameter.  No periappendiceal inflammatory changes.  Retroperitoneum: No mass or adenopathy.  Peritoneum: No free fluid or gas.  Reproductive: 12 mm posterior midline prostate cyst.  Vascular: No acute abnormality.  OSSEOUS: No acute abnormalities. No suspicious lytic or blastic lesions.  These results were called by telephone on 09/13/2012 at 06:00 a.m. to Dr Lynetta Mare, who verbally acknowledged these results.  IMPRESSION:  1.  Mildly dilated appendix (8 mm) which can be an early sign of acute appendicitis.  No confirmatory inflammatory changes. Followup imaging may be useful if there is clinical uncertainty or clinical worsening. 2.  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    Original Report Authenticated By: Tiburcio Pea    Procedures Laparoscopic Appendectomy (Dr. Janee Morn 09/13/12)  Hospital Course:  Dennis Burk Hoctor is a healthy male who presented to [facility] with RLQ abdominal pain.  Workup showed acute appendicitis without perforation.  Patient was admitted and underwent procedure listed above.  Tolerated procedure well and was transferred to the floor.  Diet was advanced as tolerated.  On POD #1, the patient was voiding well, tolerating diet, ambulating well, pain well controlled, vital signs stable, incisions c/d/i and felt stable for discharge home.  Patient will follow up in our office in 3 weeks and knows to call with questions or concerns.  Physical Exam: General:  Alert, NAD, pleasant, comfortable  Abd:  Soft, ND, mild tenderness, incisions C/D/I, drain with minimal sanguinous drainage, dressing was reapplied.     Medication List    STOP taking these medications       amoxicillin-clavulanate 875-125 MG per tablet  Commonly known as:  AUGMENTIN      TAKE these medications       HYDROcodone-acetaminophen 5-325 MG per tablet  Commonly known as:  NORCO/VICODIN  Take 1-2 tablets by mouth every 4 (four) hours as needed.     tobramycin 0.3 % ophthalmic solution  Commonly known as:  TOBREX  Place 1 drop into the left eye every 4 (four) hours.     trimethoprim-polymyxin b ophthalmic solution  Commonly known as:  POLYTRIM  Place 2 drops into the left eye every 4 (four) hours.             Follow-up Information   Follow up with [facility] [place] On 10/02/2012. (appointment time: 12:45, arrive no later than  12:15)    Contact information:   [address]   [place] [address] [phone]       Signed: Ashok Norris, [facility] [phone]  09/14/2012, 9:33 AM

## 2012-10-02 ENCOUNTER — Ambulatory Visit (INDEPENDENT_AMBULATORY_CARE_PROVIDER_SITE_OTHER): Payer: Medicaid Other | Admitting: Internal Medicine

## 2012-10-02 ENCOUNTER — Encounter (INDEPENDENT_AMBULATORY_CARE_PROVIDER_SITE_OTHER): Payer: Self-pay | Admitting: Internal Medicine

## 2012-10-02 VITALS — BP 114/70 | HR 74 | Temp 97.7°F | Resp 16 | Ht 69.0 in | Wt 131.8 lb

## 2012-10-02 DIAGNOSIS — K3580 Unspecified acute appendicitis: Secondary | ICD-10-CM

## 2012-10-02 NOTE — Progress Notes (Signed)
  Subjective: Pt returns to the clinic today after undergoing laparoscopic appendectomy on 09/13/12 by Dr. Janee Morn.  The patient is tolerating their diet well and is having no severe pain but is having some abdominal soreness.  He has not had a bowel movement in about 7 days.  Bowel function is good.  No problems with the wounds.  Objective: Vital signs in last 24 hours: Reviewed  PE: Abd: soft, non-tender, +bs, incisions well healed  Lab Results:  No results found for this basename: WBC, HGB, HCT, PLT,  in the last 72 hours BMET No results found for this basename: NA, K, CL, CO2, GLUCOSE, BUN, CREATININE, CALCIUM,  in the last 72 hours PT/INR No results found for this basename: LABPROT, INR,  in the last 72 hours CMP     Component Value Date/Time   NA 136 09/13/2012 0107   K 3.5 09/13/2012 0107   CL 99 09/13/2012 0107   CO2 29 09/13/2012 0107   GLUCOSE 108* 09/13/2012 0107   BUN 12 09/13/2012 0107   CREATININE 0.62 09/13/2012 0107   CALCIUM 9.6 09/13/2012 0107   PROT 7.2 09/13/2012 0107   ALBUMIN 4.3 09/13/2012 0107   AST 22 09/13/2012 0107   ALT 33 09/13/2012 0107   ALKPHOS 56 09/13/2012 0107   BILITOT 0.7 09/13/2012 0107   GFRNONAA >90 09/13/2012 0107   GFRAA >90 09/13/2012 0107   Lipase     Component Value Date/Time   LIPASE 12 09/13/2012 0107       Studies/Results: No results found.  Anti-infectives: Anti-infectives   None       Assessment/Plan  1.  S/P Laparoscopic Appendectomy: doing well, may resume regular activity without restrictions, would pick up colace and miralax to help with having a BM.  If he is not able to have a bowel movement in about 4-5 days then he should call our office back.  Pt will follow up with [place] PRN and knows to call with questions or concerns.     WHITE, ELIZABETH 10/02/2012

## 2012-10-02 NOTE — Patient Instructions (Signed)
May resume regular activity without restrictions. Follow up as needed. Call with questions or concerns.  

## 2012-10-11 ENCOUNTER — Emergency Department (INDEPENDENT_AMBULATORY_CARE_PROVIDER_SITE_OTHER): Payer: Medicaid Other

## 2012-10-11 ENCOUNTER — Encounter (HOSPITAL_COMMUNITY): Payer: Self-pay | Admitting: Emergency Medicine

## 2012-10-11 ENCOUNTER — Emergency Department (HOSPITAL_COMMUNITY)
Admission: EM | Admit: 2012-10-11 | Discharge: 2012-10-11 | Disposition: A | Discharge status: Home / Self Care | Payer: Medicaid Other | Source: Home / Self Care

## 2012-10-11 DIAGNOSIS — T8131XA Disruption of external operation (surgical) wound, not elsewhere classified, initial encounter: Secondary | ICD-10-CM

## 2012-10-11 DIAGNOSIS — A499 Bacterial infection, unspecified: Secondary | ICD-10-CM

## 2012-10-11 DIAGNOSIS — H1089 Other conjunctivitis: Secondary | ICD-10-CM

## 2012-10-11 DIAGNOSIS — Z5189 Encounter for other specified aftercare: Secondary | ICD-10-CM

## 2012-10-11 MED ORDER — OLOPATADINE HCL 0.2 % OP SOLN: OPHTHALMIC | Status: DC

## 2012-10-11 MED ORDER — SULFAMETHOXAZOLE-TRIMETHOPRIM 800-160 MG PO TABS: 1.0000 | ORAL_TABLET | Freq: Two times a day (BID) | ORAL | Status: DC

## 2012-10-11 NOTE — ED Notes (Addendum)
Via friend (interpreter for [place])Pt c/o drainage from laparoscopy wound sites onset 3 days Recently underwent Laparoscopic appendectomy about 3 weeks ago... Had a f/u w/[facility] on 8/19 Denies: pain, fevers Also c/o poss conjunctivitis of left eye  Alert w/no signs of acute distress

## 2012-10-11 NOTE — ED Notes (Signed)
appt was given to pt to f/u w/[facility] on 9/9 at 1015... Pt verbalized understanding (via pac[place] interpreter)

## 2012-10-11 NOTE — ED Provider Notes (Signed)
CSN: [id number]     Arrival date & time 10/11/12  1030 History   First MD Initiated Contact with Patient 10/11/12 1055     Chief Complaint  Patient presents with  . Wound Check   (Consider location/radiation/quality/duration/timing/severity/associated sxs/prior Treatment) HPI Comments: History and review of systems obtained via the patient's friend who is the translator. 22 year old male presents for evaluation of his wounds. He is status post a laparoscopic appendectomy on August 1. He has been fine since then and is currently not in any pain. He denies diffuse abdominal pain, fever, chills. He is having normal bowel movements.  Patient is a 22 y.o. male presenting with wound check.  Wound Check Pertinent negatives include no chest pain, no abdominal pain and no shortness of breath.    History reviewed. No pertinent past medical history. Past Surgical History  Procedure Laterality Date  . Appendectomy  09/12/2012  . Laparoscopic appendectomy N/A 09/13/2012    Procedure: APPENDECTOMY LAPAROSCOPIC;  Surgeon: Liz Malady, MD;  Location: [facility] OR;  Service: General;  Laterality: N/A;   No family history on file. History  Substance Use Topics  . Smoking status: Current Every Day Smoker  . Smokeless tobacco: Not on file  . Alcohol Use: Yes    Review of Systems  Constitutional: Negative for fever, chills and fatigue.  HENT: Negative for sore throat, neck pain and neck stiffness.   Eyes: Negative for visual disturbance.  Respiratory: Negative for cough and shortness of breath.   Cardiovascular: Negative for chest pain, palpitations and leg swelling.  Gastrointestinal: Negative for nausea, vomiting, abdominal pain, diarrhea and constipation.  Genitourinary: Negative for dysuria, urgency, frequency and hematuria.  Musculoskeletal: Negative for myalgias and arthralgias.  Skin: Positive for wound. Negative for rash (see HPI).  Neurological: Negative for dizziness, weakness and  light-headedness.    Allergies  Review of patient's allergies indicates no known allergies.  Home Medications   Current Outpatient Rx  Name  Route  Sig  Dispense  Refill  . Olopatadine HCl (PATADAY) 0.2 % SOLN      1 drop/eye QD   1 Bottle   0   . sulfamethoxazole-trimethoprim (SEPTRA DS) 800-160 MG per tablet   Oral   Take 1 tablet by mouth every 12 (twelve) hours.   14 tablet   0   . tobramycin (TOBREX) 0.3 % ophthalmic solution   Left Eye   Place 1 drop into the left eye every 4 (four) hours.          Marland Kitchen trimethoprim-polymyxin b (POLYTRIM) ophthalmic solution   Left Eye   Place 2 drops into the left eye every 4 (four) hours.          BP 118/71  Pulse 67  Temp(Src) 98.8 F (37.1 C) (Oral)  Resp 16  SpO2 100% Physical Exam  Nursing note and vitals reviewed. Constitutional: He is oriented to person, place, and time. He appears well-developed and well-nourished. No distress.  HENT:  Head: Normocephalic and atraumatic.  Eyes: Conjunctivae and EOM are normal. Pupils are equal, round, and reactive to light.  Pulmonary/Chest: Effort normal. No respiratory distress.  Abdominal: Soft. Bowel sounds are normal. He exhibits no mass. There is no tenderness. There is no rebound and no guarding. No hernia.    Neurological: He is alert and oriented to person, place, and time. Coordination normal.  Skin: Skin is warm and dry. No rash noted. He is not diaphoretic.  Psychiatric: He has a normal mood and affect.  ED Course  Procedures (including critical care time) Labs Review Labs Reviewed - No data to display Imaging Review Dg Abd 1 View  10/11/2012   *RADIOLOGY REPORT*  Clinical Data: Left lower quadrant abdominal pain.  ABDOMEN - 1 VIEW  Comparison: CT scan 09/13/2012.  Findings: Mild diffuse ileus bowel gas pattern with air throughout the small bowel and colon.  No findings for obstruction and/or perforation.  The soft tissue shadows are maintained.  No worrisome  calcifications.  The bony structures are intact.  IMPRESSION: Probable mild ileus.   Original Report Authenticated By: Rudie Meyer, M.D.    MDM   1. Wound dehiscence, initial encounter    This patient's wound has dehisced slightly. His x-ray reveals a slight ileus but no free air. I placed Steri-Strips across his wound, he will followup with his surgeon as soon as possible. I will also prescribe him antibiotics as the drainage does appear to be slightly better. He is afebrile at this time and appears well. Vital signs are all within normal limits. Followup for worsening pain, fever, or any other symptoms   Meds ordered this encounter  Medications  . sulfamethoxazole-trimethoprim (SEPTRA DS) 800-160 MG per tablet    Sig: Take 1 tablet by mouth every 12 (twelve) hours.    Dispense:  14 tablet    Refill:  0  . Olopatadine HCl (PATADAY) 0.2 % SOLN    Sig: 1 drop/eye QD    Dispense:  1 Bottle    Refill:  0       Graylon Good, PA-C 10/11/12 2205

## 2012-10-13 NOTE — ED Provider Notes (Signed)
Medical screening examination/treatment/procedure(s) were performed by a resident physician or non-physician practitioner and as the supervising physician I was immediately available for consultation/collaboration.  Clementeen Graham, MD   Rodolph Bong, MD 10/13/12 [phone]

## 2012-10-23 ENCOUNTER — Ambulatory Visit (INDEPENDENT_AMBULATORY_CARE_PROVIDER_SITE_OTHER): Payer: Medicaid Other | Admitting: Internal Medicine

## 2012-10-23 ENCOUNTER — Encounter (INDEPENDENT_AMBULATORY_CARE_PROVIDER_SITE_OTHER): Payer: Self-pay | Admitting: Internal Medicine

## 2012-10-23 VITALS — BP 124/74 | HR 61 | Temp 96.9°F | Resp 14 | Ht 66.54 in | Wt 133.6 lb

## 2012-10-23 DIAGNOSIS — T8132XA Disruption of internal operation (surgical) wound, not elsewhere classified, initial encounter: Secondary | ICD-10-CM

## 2012-10-23 DIAGNOSIS — K3580 Unspecified acute appendicitis: Secondary | ICD-10-CM

## 2012-10-23 NOTE — Patient Instructions (Signed)
Use antibacterial cream and dry dressing twice a day, wash with soap and water as norma, do this for one week, if not healed in one week call our office.

## 2012-10-23 NOTE — Progress Notes (Signed)
Subjective:  Pt returns to the clinic today after undergoing laparoscopic appendectomy on 09/13/12 by Dr. Janee Morn. He was seen on 8/19 and was doing well except constipation.  He presented to the ER on 8/28 due to problems with his wound.  He is here today for wound check  Objective:  Vital signs in last 24 hours:  Reviewed   PE:  Abd: soft, non-tender, +bs, small seroma in LLQ wound, drained and antibacterial cream and dry dressing applied  Assessment/Plan  1. S/P Laparoscopic Appendectomy: use antibacterial cream and dry dressing twice a day, wash with soap and water as norma, do this for one week, if not healed in one week call our office.   WHITE, ELIZABETH

## 2013-11-29 ENCOUNTER — Emergency Department (HOSPITAL_COMMUNITY): Payer: Medicaid Other

## 2013-11-29 ENCOUNTER — Emergency Department (HOSPITAL_COMMUNITY)
Admission: EM | Admit: 2013-11-29 | Discharge: 2013-11-29 | Disposition: A | Discharge status: Home / Self Care | Payer: Self-pay | Attending: Emergency Medicine | Admitting: Emergency Medicine

## 2013-11-29 ENCOUNTER — Encounter (HOSPITAL_COMMUNITY): Payer: Self-pay | Admitting: Emergency Medicine

## 2013-11-29 DIAGNOSIS — N508 Other specified disorders of male genital organs: Secondary | ICD-10-CM | POA: Insufficient documentation

## 2013-11-29 DIAGNOSIS — L0291 Cutaneous abscess, unspecified: Secondary | ICD-10-CM

## 2013-11-29 DIAGNOSIS — N50819 Testicular pain, unspecified: Secondary | ICD-10-CM

## 2013-11-29 DIAGNOSIS — Z720 Tobacco use: Secondary | ICD-10-CM | POA: Insufficient documentation

## 2013-11-29 DIAGNOSIS — Z9089 Acquired absence of other organs: Secondary | ICD-10-CM | POA: Insufficient documentation

## 2013-11-29 DIAGNOSIS — N492 Inflammatory disorders of scrotum: Secondary | ICD-10-CM | POA: Insufficient documentation

## 2013-11-29 LAB — RPR

## 2013-11-29 LAB — BASIC METABOLIC PANEL
Anion gap: 12 (ref 5–15)
BUN: 13 mg/dL (ref 6–23)
CHLORIDE: 103 meq/L (ref 96–112)
CO2: 26 meq/L (ref 19–32)
CREATININE: 0.72 mg/dL (ref 0.50–1.35)
Calcium: 9.4 mg/dL (ref 8.4–10.5)
GFR calc non Af Amer: >90 mL/min (ref >90)
Glucose, Bld: 96 mg/dL (ref 70–99)
Potassium: 3.9 mEq/L (ref 3.7–5.3)
Sodium: 141 mEq/L (ref 137–147)

## 2013-11-29 LAB — HIV ANTIBODY (ROUTINE TESTING W REFLEX): HIV 1&2 Ab, 4th Generation: NONREACTIVE

## 2013-11-29 LAB — URINALYSIS, ROUTINE W REFLEX MICROSCOPIC
BILIRUBIN URINE: NEGATIVE
Glucose, UA: NEGATIVE mg/dL
KETONES UR: NEGATIVE mg/dL
Leukocytes, UA: NEGATIVE
NITRITE: NEGATIVE
Protein, ur: NEGATIVE mg/dL
Specific Gravity, Urine: 1.029 (ref 1.005–1.030)
UROBILINOGEN UA: 1 mg/dL (ref 0.0–1.0)
pH: 6 (ref 5.0–8.0)

## 2013-11-29 LAB — URINE MICROSCOPIC-ADD ON

## 2013-11-29 LAB — CBC WITH DIFFERENTIAL/PLATELET
BASOS PCT: 0 % (ref 0–1)
Basophils Absolute: 0 10*3/uL (ref 0.0–0.1)
Eosinophils Absolute: 0.3 10*3/uL (ref 0.0–0.7)
Eosinophils Relative: 4 % (ref 0–5)
HEMATOCRIT: 43.6 % (ref 39.0–52.0)
HEMOGLOBIN: 15.1 g/dL (ref 13.0–17.0)
Lymphocytes Relative: 19 % (ref 12–46)
Lymphs Abs: 1.1 10*3/uL (ref 0.7–4.0)
MCH: 30.4 pg (ref 26.0–34.0)
MCHC: 34.6 g/dL (ref 30.0–36.0)
MCV: 87.9 fL (ref 78.0–100.0)
MONO ABS: 0.5 10*3/uL (ref 0.1–1.0)
MONOS PCT: 9 % (ref 3–12)
NEUTROS ABS: 3.9 10*3/uL (ref 1.7–7.7)
Neutrophils Relative %: 68 % (ref 43–77)
Platelets: 133 10*3/uL — ABNORMAL LOW (ref 150–400)
RBC: 4.96 MIL/uL (ref 4.22–5.81)
RDW: 12.8 % (ref 11.5–15.5)
WBC: 5.8 10*3/uL (ref 4.0–10.5)

## 2013-11-29 LAB — I-STAT CG4 LACTIC ACID, ED: Lactic Acid, Venous: 0.48 mmol/L — ABNORMAL LOW (ref 0.5–2.2)

## 2013-11-29 LAB — POC OCCULT BLOOD, ED: Fecal Occult Bld: NEGATIVE

## 2013-11-29 MED ORDER — HYDROCODONE-ACETAMINOPHEN 5-325 MG PO TABS: 1.0000 | ORAL_TABLET | ORAL | Status: DC | PRN

## 2013-11-29 MED ORDER — CLINDAMYCIN HCL 150 MG PO CAPS: 300.0000 mg | ORAL_CAPSULE | Freq: Four times a day (QID) | ORAL | Status: DC

## 2013-11-29 MED ORDER — CLINDAMYCIN PHOSPHATE 600 MG/50ML IV SOLN
600.0000 mg | Freq: Once | INTRAVENOUS | Status: AC
  Administered 2013-11-29: 600 mg via INTRAVENOUS
  Filled 2013-11-29: qty 50

## 2013-11-29 MED ORDER — OXYCODONE-ACETAMINOPHEN 5-325 MG PO TABS
2.0000 | ORAL_TABLET | Freq: Once | ORAL | Status: AC
  Administered 2013-11-29: 2 via ORAL
  Filled 2013-11-29: qty 2

## 2013-11-29 MED ORDER — ONDANSETRON 4 MG PO TBDP
8.0000 mg | ORAL_TABLET | Freq: Once | ORAL | Status: AC
  Administered 2013-11-29: 8 mg via ORAL
  Filled 2013-11-29: qty 2

## 2013-11-29 MED ORDER — KETOROLAC TROMETHAMINE 30 MG/ML IJ SOLN
30.0000 mg | Freq: Once | INTRAMUSCULAR | Status: AC
  Administered 2013-11-29: 30 mg via INTRAVENOUS
  Filled 2013-11-29: qty 1

## 2013-11-29 NOTE — ED Notes (Signed)
Report given to Danielle, RN.  

## 2013-11-29 NOTE — ED Notes (Signed)
Pt currently in US.

## 2013-11-29 NOTE — ED Provider Notes (Signed)
MSE was initiated and I personally evaluated the patient and placed orders (if any) at  1:21 PM on November 29, 2013.  Patient presents emergency department for evaluation of testicular pain. The patient with abscess to testicle as well as complaints of blood in his stool. Will get formal ultrasound to evaluate depth of the abscess, look for subcutaneous gas. Patient is clearly uncomfortable, will give percocet.   The patient appears stable so that the remainder of the MSE may be completed by another provider.  Mora BellmanHannah S Laylani Pudwill, PA-C 11/29/13 1324

## 2013-11-29 NOTE — ED Provider Notes (Signed)
Medical screening examination/treatment/procedure(s) were performed by non-physician practitioner and as supervising physician I was immediately available for consultation/collaboration.   EKG Interpretation None        Larkin Alfred, DO 11/29/13 2022 

## 2013-11-29 NOTE — ED Notes (Signed)
PA requested pt be moved to acute side. Charge nurse notified.

## 2013-11-29 NOTE — ED Notes (Signed)
Left groin pain x 2 weeks

## 2013-11-29 NOTE — ED Provider Notes (Signed)
CSN: [id number]     Arrival date & time 11/29/13  1131 History   First MD Initiated Contact with Patient 11/29/13 1235     Chief Complaint  Patient presents with  . Groin Pain     (Consider location/radiation/quality/duration/timing/severity/associated sxs/prior Treatment) HPI Comments: Patient presents to the ER for evaluation of pain and swelling in the left scrotum for 2 weeks. Has been draining. Patient denies injury.  Patient is a 23 y.o. male presenting with groin pain.  Groin Pain    History reviewed. No pertinent past medical history. Past Surgical History  Procedure Laterality Date  . Appendectomy  09/12/2012  . Laparoscopic appendectomy N/A 09/13/2012    Procedure: APPENDECTOMY LAPAROSCOPIC;  Surgeon: Liz MaladyBurke E Thompson, MD;  [facility];  Service: General;  Laterality: N/A;   No family history on file. History  Substance Use Topics  . Smoking status: Current Every Day Smoker  . Smokeless tobacco: Not on file  . Alcohol Use: Yes    Review of Systems  Genitourinary: Positive for scrotal swelling.  Skin: Positive for wound.  All other systems reviewed and are negative.     Allergies  Review of patient's allergies indicates no known allergies.  Home Medications   Prior to Admission medications   Not on File   BP 112/64  Pulse 70  Temp(Src) 98.6 F (37 C) (Oral)  Resp 15  SpO2 98% Physical Exam  Constitutional: He is oriented to person, place, and time. He appears well-developed and well-nourished. No distress.  HENT:  Head: Normocephalic and atraumatic.  Right Ear: Hearing normal.  Left Ear: Hearing normal.  Nose: Nose normal.  Mouth/Throat: Oropharynx is clear and moist and mucous membranes are normal.  Eyes: Conjunctivae and EOM are normal. Pupils are equal, round, and reactive to light.  Neck: Normal range of motion. Neck supple.  Cardiovascular: Regular rhythm, S1 normal and S2 normal.  Exam reveals no gallop and no friction rub.   No  murmur heard. Pulmonary/Chest: Effort normal and breath sounds normal. No respiratory distress. He exhibits no tenderness.  Abdominal: Soft. Normal appearance and bowel sounds are normal. There is no hepatosplenomegaly. There is no tenderness. There is no rebound, no guarding, no tenderness at McBurney's point and negative Murphy's sign. No hernia.  Genitourinary:     Musculoskeletal: Normal range of motion.  Neurological: He is alert and oriented to person, place, and time. He has normal strength. No cranial nerve deficit or sensory deficit. Coordination normal. GCS eye subscore is 4. GCS verbal subscore is 5. GCS motor subscore is 6.  Skin: Skin is warm, dry and intact. No rash noted. No cyanosis.  Psychiatric: He has a normal mood and affect. His speech is normal and behavior is normal. Thought content normal.    ED Course  Procedures (including critical care time) Labs Review Labs Reviewed  CBC WITH DIFFERENTIAL - Abnormal; Notable for the following:    Platelets 133 (*)    All other components within normal limits  URINALYSIS, ROUTINE W REFLEX MICROSCOPIC - Abnormal; Notable for the following:    Hgb urine dipstick SMALL (*)    All other components within normal limits  I-STAT CG4 LACTIC ACID, ED - Abnormal; Notable for the following:    Lactic Acid, Venous 0.48 (*)    All other components within normal limits  URINE CULTURE  BASIC METABOLIC PANEL  URINE MICROSCOPIC-ADD ON  RPR  HIV ANTIBODY (ROUTINE TESTING)  POC OCCULT BLOOD, ED    Imaging Review [place]  Scrotum  11/29/2013   CLINICAL DATA:  Testicular pain. Testicular pain for 2 weeks. Small skin infection on left scrotum  EXAM: ULTRASOUND OF SCROTUM  TECHNIQUE: Complete ultrasound examination of the testicles, epididymis, and other scrotal structures was performed.  COMPARISON:  None.  FINDINGS: Right testicle  Measurements: 2.2 x 2.4 x 3.6 cm. There are innumerable small echogenic foci consistent with microlithiasis. There  is normal arterial and venous Doppler waveforms. Small hydrocele. No evidence of testicular abscess  Left testicle  Measurements: 2.3 x 2.5 x 3.2 cm. There are innumerable small echogenic foci within the testicle consists with microlithiasis. Small hydrocele present. Normal vertebral venous Doppler waveforms. No testicular abscess  Right epididymis:  Normal in size and appearance.  Left epididymis:  Normal in size and appearance.  Hydrocele:  Small bilateral  Varicocele:  None  There is skin thickening of the left and right scrotum, more so on the left.  IMPRESSION: 1. No evidence of testicular abscess. 2. Bilateral small hydroceles. 3. No evidence of testicular torsion. 4. Scrotal  thickening on the left likely represents cellulitis. 5. Marked bilateral microlithiasis. Current literature suggests that testicular microlithiasis is not a significant independent risk factor for development of testicular carcinoma, and that follow up imaging is not warranted in the absence of other risk factors. Monthly testicular self-examination and annual physical exams are considered appropriate surveillance. If patient has other risk factors for testicular carcinoma, then referral to Urology should be considered. (Reference: DeCastro, et al.: A 5-Year Follow up Study of Asymptomatic Men with Testicular Microlithiasis. J Urol 2008; 179:1420-1423.)   Electronically Signed   By: Genevive Bi M.D.   On: 11/29/2013 15:47   [place] Art/ven Flow Abd Pelv Doppler  11/29/2013   CLINICAL DATA:  Testicular pain. Testicular pain for 2 weeks. Small skin infection on left scrotum  EXAM: ULTRASOUND OF SCROTUM  TECHNIQUE: Complete ultrasound examination of the testicles, epididymis, and other scrotal structures was performed.  COMPARISON:  None.  FINDINGS: Right testicle  Measurements: 2.2 x 2.4 x 3.6 cm. There are innumerable small echogenic foci consistent with microlithiasis. There is normal arterial and venous Doppler waveforms. Small  hydrocele. No evidence of testicular abscess  Left testicle  Measurements: 2.3 x 2.5 x 3.2 cm. There are innumerable small echogenic foci within the testicle consists with microlithiasis. Small hydrocele present. Normal vertebral venous Doppler waveforms. No testicular abscess  Right epididymis:  Normal in size and appearance.  Left epididymis:  Normal in size and appearance.  Hydrocele:  Small bilateral  Varicocele:  None  There is skin thickening of the left and right scrotum, more so on the left.  IMPRESSION: 1. No evidence of testicular abscess. 2. Bilateral small hydroceles. 3. No evidence of testicular torsion. 4. Scrotal  thickening on the left likely represents cellulitis. 5. Marked bilateral microlithiasis. Current literature suggests that testicular microlithiasis is not a significant independent risk factor for development of testicular carcinoma, and that follow up imaging is not warranted in the absence of other risk factors. Monthly testicular self-examination and annual physical exams are considered appropriate surveillance. If patient has other risk factors for testicular carcinoma, then referral to Urology should be considered. (Reference: DeCastro, et al.: A 5-Year Follow up Study of Asymptomatic Men with Testicular Microlithiasis. J Urol 2008; 179:1420-1423.)   Electronically Signed   By: Genevive Bi M.D.   On: 11/29/2013 15:47     EKG Interpretation None      MDM   Final diagnoses:  Abscess  Testicular  pain   Patient presents to the ER for evaluation of pain and swelling of the testicle region. Extremities revealed significant induration and edema of the scrotum, commonly on the left side. There is an ulceration with drainage. Patient does not have a fever. Vital signs are all normal. Patient does not have leukocytosis. Ultrasound performed, no evidence of torsion. No testicular abscess. Findings are consistent with an acute cellulitis. There was likely some abscess formation  previously which has spontaneously drained. Patient administered IV clindamycin. Patient is appropriate for discharge and outpatient followup. Discussed briefly with Doctor Patsi Searsannenbaum, urology. Patient to be seen in the office in followup. Provided hydrocodone for pain, continue oral clindamycin. HIV, RPR pending.    Gilda Creasehristopher J. Pollina, MD 11/29/13 [phone]

## 2013-11-29 NOTE — ED Notes (Signed)
Pt c/o genital problem. Does not speak English, speaks [place].

## 2013-11-29 NOTE — Discharge Instructions (Signed)

## 2013-11-30 LAB — URINE CULTURE

## 2013-12-03 LAB — WOUND CULTURE

## 2013-12-05 ENCOUNTER — Telehealth (HOSPITAL_COMMUNITY): Payer: Self-pay

## 2013-12-05 NOTE — ED Notes (Signed)
Post ED Visit - Positive Culture Follow-up  Culture report reviewed by antimicrobial stewardship pharmacist: [x]  Wes Dulaney, Pharm.D., BCPS []  Celedonio MiyamotoJeremy Frens, Pharm.D., BCPS []  Georgina PillionElizabeth Martin, [address] BCPS []  SylvarenaMinh Pham, [address]Ph[address] BCPS, AAHIVP []  Estella HuskMichelle Turner, Pharm.D., BCPS, AAHIVP []  Carly Sabat, Pharm.D. []  Enzo BiNathan Batchelder, [address]  Positive wound culture Treated with clindamycin, organism sensitive to the same and no further patient follow-up is required at this time.  Ashley JacobsFesterman, Mitsuo Budnick C 12/05/2013, 7:34 AM

## 2015-09-11 ENCOUNTER — Emergency Department (HOSPITAL_COMMUNITY)
Admission: EM | Admit: 2015-09-11 | Discharge: 2015-09-11 | Disposition: A | Discharge status: Home / Self Care | Payer: Self-pay | Attending: Emergency Medicine | Admitting: Emergency Medicine

## 2015-09-11 ENCOUNTER — Encounter (HOSPITAL_COMMUNITY): Payer: Self-pay | Admitting: Emergency Medicine

## 2015-09-11 ENCOUNTER — Emergency Department (HOSPITAL_COMMUNITY): Payer: Self-pay

## 2015-09-11 DIAGNOSIS — R519 Headache, unspecified: Secondary | ICD-10-CM

## 2015-09-11 DIAGNOSIS — R51 Headache: Secondary | ICD-10-CM | POA: Insufficient documentation

## 2015-09-11 DIAGNOSIS — F17210 Nicotine dependence, cigarettes, uncomplicated: Secondary | ICD-10-CM | POA: Insufficient documentation

## 2015-09-11 MED ORDER — DIPHENHYDRAMINE HCL 25 MG PO CAPS
50.0000 mg | ORAL_CAPSULE | Freq: Once | ORAL | Status: AC
  Administered 2015-09-11: 50 mg via ORAL
  Filled 2015-09-11: qty 2

## 2015-09-11 MED ORDER — KETOROLAC TROMETHAMINE 30 MG/ML IJ SOLN
30.0000 mg | Freq: Once | INTRAMUSCULAR | Status: AC
  Administered 2015-09-11: 30 mg via INTRAMUSCULAR
  Filled 2015-09-11: qty 1

## 2015-09-11 MED ORDER — METOCLOPRAMIDE HCL 10 MG PO TABS
10.0000 mg | ORAL_TABLET | Freq: Once | ORAL | Status: AC
  Administered 2015-09-11: 10 mg via ORAL
  Filled 2015-09-11: qty 1

## 2015-09-11 NOTE — Discharge Instructions (Signed)
There does not appear to be an emergent cause for your headache at this time. Your exam, CT scan were very reassuring. He may take Tylenol and Motrin for future headaches. Follow-up with your doctor or [facility] for reevaluation and definitive care. Return to ED for new or worsening symptoms.

## 2015-09-11 NOTE — ED Notes (Signed)
Patient back from CT.

## 2015-09-11 NOTE — ED Triage Notes (Signed)
Patient states headache off and on for 3 weeks.  Patient states he has not taken anything for pain at home.  Patient denies other problems.

## 2015-09-11 NOTE — ED Provider Notes (Signed)
[facility] Provider Note   CSN: [id number] Arrival date & time: 09/11/15  4098  First Provider Contact:  First MD Initiated Contact with Patient 09/11/15 [phone]        History   Chief Complaint Chief Complaint  Patient presents with  . Migraine    HPI Quamere Elward Nocera is a 25 y.o. male here for evaluation of intermittent headaches over the past 3 weeks. Patient reports he has been drinking energy drinks andhe started having headaches. Headache is throbbing in nature, localized to the right side of his head and face. No fevers, chills, neck pain or stiffness, vision changes, numbness or weakness, rash. No recent travel or surgeries, medications. Has mild photophobia, no phonophobia. Has not tried anything to improve his symptoms. Nothing makes problem better or worse. No other modifying factors.  HPI  History reviewed. No pertinent past medical history.  Patient Active Problem List   Diagnosis Date Noted  . Appendicitis, acute 09/13/2012    Past Surgical History:  Procedure Laterality Date  . APPENDECTOMY  09/12/2012  . LAPAROSCOPIC APPENDECTOMY N/A 09/13/2012   Procedure: APPENDECTOMY LAPAROSCOPIC;  Surgeon: Liz Malady, MD;  Location: [facility] OR;  Service: General;  Laterality: N/A;       Home Medications    Prior to Admission medications   Medication Sig Start Date End Date Taking? Authorizing Provider  clindamycin (CLEOCIN) 150 MG capsule Take 2 capsules (300 mg total) by mouth 4 (four) times daily. 11/29/13   Gilda Crease, MD  HYDROcodone-acetaminophen (NORCO/VICODIN) 5-325 MG per tablet Take 1-2 tablets by mouth every 4 (four) hours as needed for moderate pain. 11/29/13   Gilda Crease, MD    Family History No family history on file.  Social History Social History  Substance Use Topics  . Smoking status: Current Every Day Smoker    Packs/day: 0.50    Types: Cigarettes  . Smokeless tobacco: Not on file  . Alcohol use Yes      Allergies   Review of patient's allergies indicates no known allergies.   Review of Systems Review of Systems A 10 point review of systems was completed and was negative except for pertinent positives and negatives as mentioned in the history of present illness    Physical Exam Updated Vital Signs BP 118/67   Pulse (!) 58   Temp 97.8 F (36.6 C) (Oral)   Resp 18   SpO2 100%   Physical Exam  Constitutional: He is oriented to person, place, and time. He appears well-developed and well-nourished. No distress.  HENT:  Head: Normocephalic and atraumatic.  Mouth/Throat: Oropharynx is clear and moist.  Eyes: Conjunctivae and EOM are normal. Pupils are equal, round, and reactive to light.  Neck: Normal range of motion.  No meningismus or nuchal rigidity  Cardiovascular: Normal rate, regular rhythm and normal heart sounds.   Pulmonary/Chest: Effort normal and breath sounds normal.  Abdominal: Soft. There is no tenderness.  Musculoskeletal: Normal range of motion. He exhibits no edema or tenderness.  Neurological: He is alert and oriented to person, place, and time.  Cranial nerves III through XII grossly intact. Motor strength is 5/5 in all 4 extremities and sensation is intact to light touch. Completes finger to nose coordination movements without difficulty. No nystagmus. Gait is baseline without ataxia.  Skin: No rash noted. He is not diaphoretic.  Psychiatric: He has a normal mood and affect. His behavior is normal. Judgment and thought content normal.     ED Treatments /  Results  Labs (all labs ordered are listed, but only abnormal results are displayed) Labs Reviewed - No data to display  EKG  EKG Interpretation None       Radiology Ct Head Wo Contrast  Result Date: 09/11/2015 CLINICAL DATA:  Intermittent right side headache for 3 weeks. No known injury. Initial encounter. EXAM: CT HEAD WITHOUT CONTRAST TECHNIQUE: Contiguous axial images were obtained from the  base of the skull through the vertex without intravenous contrast. COMPARISON:  None. FINDINGS: The brain appears normal without hemorrhage, infarct, mass lesion, mass effect, midline shift or abnormal extra-axial fluid collection. No hydrocephalus or pneumocephalus. The calvarium is intact. Small mucous retention cyst or polyp in the left maxillary sinus is identified. IMPRESSION: No acute intracranial abnormality. Small mucous retention cyst or polyp left maxillary sinus. Electronically Signed   By: Drusilla Kanner M.D.   On: 09/11/2015 10:41   Procedures Procedures (including critical care time)  Medications Ordered in ED Medications  ketorolac (TORADOL) 30 MG/ML injection 30 mg (30 mg Intramuscular Given 09/11/15 0920)  diphenhydrAMINE (BENADRYL) capsule 50 mg (50 mg Oral Given 09/11/15 0920)  metoCLOPramide (REGLAN) tablet 10 mg (10 mg Oral Given 09/11/15 0920)     Initial Impression / Assessment and Plan / ED Course  I have reviewed the triage vital signs and the nursing notes.  Pertinent labs & imaging results that were available during my care of the patient were reviewed by me and considered in my medical decision making (see chart for details).  Clinical Course    Patient with headaches for the past 3 weeks. Likely secondary to energy drink use. However, difficult to elicit detailed history due to language barrier. Counseled on cessation of energy drinks at this time. Patient does have history of questionable testicular carcinoma-due to new onset of headaches, language barrier, will obtain CT head to rule out space-occupying lesion.  Low suspicion for ICH, meningitis or other acute intracranial pathology.  Treated with migraine cocktail, patient responds well. Given referral to PCP. Discussed with Dr. Erma Heritage.  Patient states he feels much better, headache resolved. CT negative for emergent intracranial abnormalities. Overall appears gray well, nontoxic, hemodynamically stable and  appropriate for discharge and outpatient follow-up.  Final Clinical Impressions(s) / ED Diagnoses   Final diagnoses:  Nonintractable headache, unspecified chronicity pattern, unspecified headache type    New Prescriptions New Prescriptions   No medications on file     Joycie Peek, PA-C 09/11/15 1117    Shaune Pollack, MD 09/12/15 2139

## 2015-09-12 ENCOUNTER — Encounter (HOSPITAL_COMMUNITY): Payer: Self-pay | Admitting: Emergency Medicine

## 2015-09-12 ENCOUNTER — Emergency Department (HOSPITAL_COMMUNITY)
Admission: EM | Admit: 2015-09-12 | Discharge: 2015-09-12 | Disposition: A | Discharge status: Home / Self Care | Payer: Medicaid Other | Attending: Emergency Medicine | Admitting: Emergency Medicine

## 2015-09-12 ENCOUNTER — Telehealth (HOSPITAL_BASED_OUTPATIENT_CLINIC_OR_DEPARTMENT_OTHER): Payer: Self-pay

## 2015-09-12 DIAGNOSIS — F17210 Nicotine dependence, cigarettes, uncomplicated: Secondary | ICD-10-CM | POA: Insufficient documentation

## 2015-09-12 DIAGNOSIS — R519 Headache, unspecified: Secondary | ICD-10-CM

## 2015-09-12 DIAGNOSIS — B353 Tinea pedis: Secondary | ICD-10-CM | POA: Insufficient documentation

## 2015-09-12 DIAGNOSIS — R51 Headache: Secondary | ICD-10-CM | POA: Insufficient documentation

## 2015-09-12 MED ORDER — IBUPROFEN 600 MG PO TABS: 600.0000 mg | ORAL_TABLET | Freq: Four times a day (QID) | ORAL | 0 refills | Status: DC | PRN

## 2015-09-12 MED ORDER — METOCLOPRAMIDE HCL 5 MG/ML IJ SOLN
10.0000 mg | Freq: Once | INTRAMUSCULAR | Status: AC
  Administered 2015-09-12: 10 mg via INTRAVENOUS
  Filled 2015-09-12: qty 2

## 2015-09-12 MED ORDER — DIPHENHYDRAMINE HCL 50 MG/ML IJ SOLN
25.0000 mg | Freq: Once | INTRAMUSCULAR | Status: AC
  Administered 2015-09-12: 25 mg via INTRAVENOUS
  Filled 2015-09-12: qty 1

## 2015-09-12 MED ORDER — SODIUM CHLORIDE 0.9 % IV BOLUS (SEPSIS)
1000.0000 mL | Freq: Once | INTRAVENOUS | Status: AC
  Administered 2015-09-12: 1000 mL via INTRAVENOUS

## 2015-09-12 MED ORDER — KETOROLAC TROMETHAMINE 30 MG/ML IJ SOLN
30.0000 mg | Freq: Once | INTRAMUSCULAR | Status: AC
  Administered 2015-09-12: 30 mg via INTRAVENOUS
  Filled 2015-09-12: qty 1

## 2015-09-12 MED ORDER — PROMETHAZINE HCL 25 MG PO TABS: 25.0000 mg | ORAL_TABLET | Freq: Four times a day (QID) | ORAL | 0 refills | Status: DC | PRN

## 2015-09-12 NOTE — ED Triage Notes (Signed)
Pt c/o headache for 3 weeks; no meds at home; No fevers; c/o right visual field disturbance.  Numbness in both feet.

## 2015-09-12 NOTE — ED Provider Notes (Signed)
[facility] Provider Note   CSN: [id number] Arrival date & time: 09/12/15  1339  First Provider Contact:  None       History   Chief Complaint Chief Complaint  Patient presents with  . Headache    HPI Sean Ingram is a 25 y.o. male.  Pt here with a headache that has been intermittent for the past 3 weeks.  He was seen in the ED yesterday.  He was treated and felt better.  He had a negative head CT.  The pt said that he was not given any meds to take at home and his h/a is back.  The pt denies any f/c.  No n/v.      Headache      History reviewed. No pertinent past medical history.  Patient Active Problem List   Diagnosis Date Noted  . Appendicitis, acute 09/13/2012    Past Surgical History:  Procedure Laterality Date  . APPENDECTOMY  09/12/2012  . LAPAROSCOPIC APPENDECTOMY N/A 09/13/2012   Procedure: APPENDECTOMY LAPAROSCOPIC;  Surgeon: Liz Malady, MD;  [facility];  Service: General;  Laterality: N/A;       Home Medications    Prior to Admission medications   Medication Sig Start Date End Date Taking? Authorizing Provider  clindamycin (CLEOCIN) 150 MG capsule Take 2 capsules (300 mg total) by mouth 4 (four) times daily. 11/29/13   Gilda Crease, MD  HYDROcodone-acetaminophen (NORCO/VICODIN) 5-325 MG per tablet Take 1-2 tablets by mouth every 4 (four) hours as needed for moderate pain. 11/29/13   Gilda Crease, MD  ibuprofen (ADVIL,MOTRIN) 600 MG tablet Take 1 tablet (600 mg total) by mouth every 6 (six) hours as needed. 09/12/15   Jacalyn Lefevre, MD  promethazine (PHENERGAN) 25 MG tablet Take 1 tablet (25 mg total) by mouth every 6 (six) hours as needed for nausea or vomiting. 09/12/15   Jacalyn Lefevre, MD    Family History History reviewed. No pertinent family history.  Social History Social History  Substance Use Topics  . Smoking status: Current Every Day Smoker    Packs/day: 0.50    Types: Cigarettes  . Smokeless  tobacco: Never Used  . Alcohol use Yes     Allergies   Review of patient's allergies indicates no known allergies.   Review of Systems Review of Systems  Neurological: Positive for headaches.  All other systems reviewed and are negative.    Physical Exam Updated Vital Signs BP 110/72   Pulse (!) 58   Temp 98.3 F (36.8 C) (Oral)   Resp (!) 165   SpO2 100%   Physical Exam  Constitutional: He is oriented to person, place, and time. He appears well-developed and well-nourished.  HENT:  Head: Normocephalic and atraumatic.  Right Ear: External ear normal.  Left Ear: External ear normal.  Nose: Nose normal.  Mouth/Throat: Oropharynx is clear and moist.  Eyes: Conjunctivae and EOM are normal. Pupils are equal, round, and reactive to light.  Neck: Normal range of motion. Neck supple.  Cardiovascular: Normal rate, regular rhythm, normal heart sounds and intact distal pulses.   Pulmonary/Chest: Effort normal and breath sounds normal.  Abdominal: Soft. Bowel sounds are normal.  Musculoskeletal: Normal range of motion.  Neurological: He is alert and oriented to person, place, and time.  Skin: Skin is warm and dry.  Athlete's foot noted between toes.  Psychiatric: He has a normal mood and affect. His behavior is normal. Judgment and thought content normal.  Nursing note  and vitals reviewed.    ED Treatments / Results  Labs (all labs ordered are listed, but only abnormal results are displayed) Labs Reviewed - No data to display  EKG  EKG Interpretation None       Radiology Ct Head Wo Contrast  Result Date: 09/11/2015 CLINICAL DATA:  Intermittent right side headache for 3 weeks. No known injury. Initial encounter. EXAM: CT HEAD WITHOUT CONTRAST TECHNIQUE: Contiguous axial images were obtained from the base of the skull through the vertex without intravenous contrast. COMPARISON:  None. FINDINGS: The brain appears normal without hemorrhage, infarct, mass lesion, mass  effect, midline shift or abnormal extra-axial fluid collection. No hydrocephalus or pneumocephalus. The calvarium is intact. Small mucous retention cyst or polyp in the left maxillary sinus is identified. IMPRESSION: No acute intracranial abnormality. Small mucous retention cyst or polyp left maxillary sinus. Electronically Signed   By: Drusilla Kanner M.D.   On: 09/11/2015 10:41   Procedures Procedures (including critical care time)  Medications Ordered in ED Medications  sodium chloride 0.9 % bolus 1,000 mL (1,000 mLs Intravenous New Bag/Given 09/12/15 1836)  ketorolac (TORADOL) 30 MG/ML injection 30 mg (30 mg Intravenous Given 09/12/15 1837)  metoCLOPramide (REGLAN) injection 10 mg (10 mg Intravenous Given 09/12/15 1837)  diphenhydrAMINE (BENADRYL) injection 25 mg (25 mg Intravenous Given 09/12/15 1836)     Initial Impression / Assessment and Plan / ED Course  I have reviewed the triage vital signs and the nursing notes.  Pertinent labs & imaging results that were available during my care of the patient were reviewed by me and considered in my medical decision making (see chart for details).  Clinical Course   Pt is feeling better.  He will be d/c'd home and given the number to neuro to f/u.  Return if worse.  Final Clinical Impressions(s) / ED Diagnoses   Final diagnoses:  Acute nonintractable headache, unspecified headache type    New Prescriptions New Prescriptions   IBUPROFEN (ADVIL,MOTRIN) 600 MG TABLET    Take 1 tablet (600 mg total) by mouth every 6 (six) hours as needed.   PROMETHAZINE (PHENERGAN) 25 MG TABLET    Take 1 tablet (25 mg total) by mouth every 6 (six) hours as needed for nausea or vomiting.     Jacalyn Lefevre, MD 09/12/15 1911

## 2015-09-12 NOTE — ED Notes (Signed)
Pt verbalized understanding of d/c instructions and has no further questions. Pt stable and NAD. Pt pain free. Pt removed all belongings from room. VSS

## 2015-11-16 ENCOUNTER — Encounter (HOSPITAL_COMMUNITY): Payer: Self-pay

## 2015-11-16 ENCOUNTER — Emergency Department (HOSPITAL_COMMUNITY)
Admission: EM | Admit: 2015-11-16 | Discharge: 2015-11-16 | Disposition: A | Discharge status: Home / Self Care | Payer: Medicaid Other | Attending: Emergency Medicine | Admitting: Emergency Medicine

## 2015-11-16 DIAGNOSIS — B354 Tinea corporis: Secondary | ICD-10-CM | POA: Insufficient documentation

## 2015-11-16 DIAGNOSIS — R748 Abnormal levels of other serum enzymes: Secondary | ICD-10-CM

## 2015-11-16 DIAGNOSIS — F17210 Nicotine dependence, cigarettes, uncomplicated: Secondary | ICD-10-CM | POA: Insufficient documentation

## 2015-11-16 DIAGNOSIS — R945 Abnormal results of liver function studies: Secondary | ICD-10-CM | POA: Insufficient documentation

## 2015-11-16 DIAGNOSIS — B353 Tinea pedis: Secondary | ICD-10-CM | POA: Insufficient documentation

## 2015-11-16 LAB — URINALYSIS, ROUTINE W REFLEX MICROSCOPIC
Bilirubin Urine: NEGATIVE
Glucose, UA: NEGATIVE mg/dL
Ketones, ur: NEGATIVE mg/dL
Leukocytes, UA: NEGATIVE
NITRITE: NEGATIVE
PH: 6 (ref 5.0–8.0)
Protein, ur: NEGATIVE mg/dL
SPECIFIC GRAVITY, URINE: 1.02 (ref 1.005–1.030)

## 2015-11-16 LAB — CBC WITH DIFFERENTIAL/PLATELET
Basophils Absolute: 0 10*3/uL (ref 0.0–0.1)
Basophils Relative: 0 %
EOS ABS: 0.5 10*3/uL (ref 0.0–0.7)
EOS PCT: 11 %
HCT: 43.9 % (ref 39.0–52.0)
Hemoglobin: 15.2 g/dL (ref 13.0–17.0)
LYMPHS PCT: 35 %
Lymphs Abs: 1.6 10*3/uL (ref 0.7–4.0)
MCH: 30 pg (ref 26.0–34.0)
MCHC: 34.6 g/dL (ref 30.0–36.0)
MCV: 86.6 fL (ref 78.0–100.0)
MONOS PCT: 7 %
Monocytes Absolute: 0.3 10*3/uL (ref 0.1–1.0)
NEUTROS PCT: 47 %
Neutro Abs: 2.1 10*3/uL (ref 1.7–7.7)
PLATELETS: 134 10*3/uL — AB (ref 150–400)
RBC: 5.07 MIL/uL (ref 4.22–5.81)
RDW: 12.6 % (ref 11.5–15.5)
WBC: 4.5 10*3/uL (ref 4.0–10.5)

## 2015-11-16 LAB — URINE MICROSCOPIC-ADD ON
Bacteria, UA: NONE SEEN
SQUAMOUS EPITHELIAL / LPF: NONE SEEN

## 2015-11-16 LAB — COMPREHENSIVE METABOLIC PANEL
ALT: 98 U/L — ABNORMAL HIGH (ref 17–63)
AST: 60 U/L — ABNORMAL HIGH (ref 15–41)
Albumin: 4 g/dL (ref 3.5–5.0)
Alkaline Phosphatase: 68 U/L (ref 38–126)
Anion gap: 8 (ref 5–15)
BUN: 16 mg/dL (ref 6–20)
CALCIUM: 9.3 mg/dL (ref 8.9–10.3)
CHLORIDE: 106 mmol/L (ref 101–111)
CO2: 25 mmol/L (ref 22–32)
Creatinine, Ser: 0.71 mg/dL (ref 0.61–1.24)
GFR calc non Af Amer: >60 mL/min (ref >60)
Glucose, Bld: 123 mg/dL — ABNORMAL HIGH (ref 65–99)
Potassium: 3.4 mmol/L — ABNORMAL LOW (ref 3.5–5.1)
SODIUM: 139 mmol/L (ref 135–145)
Total Bilirubin: 0.6 mg/dL (ref 0.3–1.2)
Total Protein: 6.7 g/dL (ref 6.5–8.1)

## 2015-11-16 MED ORDER — CLOTRIMAZOLE-BETAMETHASONE 1-0.05 % EX CREA: TOPICAL_CREAM | CUTANEOUS | 0 refills | Status: DC

## 2015-11-16 NOTE — ED Triage Notes (Signed)
Pt speaks [place]. 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 Pt is coming from home with complaints of papular rash noted to the abdomen, penis, and left leg that has been draining serous fluid for the past month. Pt reports pruritis and pain with rash. Denies any new foods, soaps, detergents, or lotions.

## 2015-11-16 NOTE — Discharge Instructions (Signed)
Your liver tests were slightly elevated. These will need to be rechecked in the next month Apply small amount of ointment to the toes and penis daily.  You must get a primary care doctor.

## 2015-11-16 NOTE — ED Provider Notes (Addendum)
[facility]EMERG[facility]Y DEPT Provider Note   CSN: [id number] Arrival date & time: 11/16/15  4098     History   Chief Complaint Chief Complaint  Patient presents with  . Rash    HPI Sean Ingram is a 25 y.o. male.  Patient complains of a rash on the toes of his foot and on the shaft of his penis. He is not sexually active. He is from [place], and his wife remains in [place]. Rash is pruritic. No involvement of finger webs. No penile discharge. No fever, sweats, chills. Severity is mild to moderate.      History reviewed. No pertinent past medical history.  Patient Active Problem List   Diagnosis Date Noted  . Appendicitis, acute 09/13/2012    Past Surgical History:  Procedure Laterality Date  . APPENDECTOMY  09/12/2012  . LAPAROSCOPIC APPENDECTOMY N/A 09/13/2012   Procedure: APPENDECTOMY LAPAROSCOPIC;  Surgeon: Liz Malady, MD;  Location: [facility] OR;  Service: General;  Laterality: N/A;       Home Medications    Prior to Admission medications   Medication Sig Start Date End Date Taking? Authorizing Provider  clindamycin (CLEOCIN) 150 MG capsule Take 2 capsules (300 mg total) by mouth 4 (four) times daily. 11/29/13   Gilda Crease, MD  clotrimazole-betamethasone (LOTRISONE) cream Apply to affected area 2 times daily prn 11/16/15   Donnetta Hutching, MD  HYDROcodone-acetaminophen (NORCO/VICODIN) 5-325 MG per tablet Take 1-2 tablets by mouth every 4 (four) hours as needed for moderate pain. 11/29/13   Gilda Crease, MD  ibuprofen (ADVIL,MOTRIN) 600 MG tablet Take 1 tablet (600 mg total) by mouth every 6 (six) hours as needed. 09/12/15   Jacalyn Lefevre, MD  promethazine (PHENERGAN) 25 MG tablet Take 1 tablet (25 mg total) by mouth every 6 (six) hours as needed for nausea or vomiting. 09/12/15   Jacalyn Lefevre, MD    Family History No family history on file.  Social History Social History  Substance Use Topics  . Smoking status: Current Every Day Smoker   Packs/day: 0.50    Types: Cigarettes  . Smokeless tobacco: Never Used  . Alcohol use Yes     Allergies   Review of patient's allergies indicates no known allergies.   Review of Systems Review of Systems  All other systems reviewed and are negative.    Physical Exam Updated Vital Signs BP 115/68 (BP Location: Right Arm)   Pulse 72   Temp 98.6 F (37 C) (Oral)   Resp 20   Ht 5\' 6"  (1.676 m)   Wt 134 lb (60.8 kg)   SpO2 99%   BMI 21.63 kg/m   Physical Exam  Constitutional: He is oriented to person, place, and time. He appears well-developed and well-nourished.  HENT:  Head: Normocephalic and atraumatic.  Eyes: Conjunctivae are normal.  Neck: Neck supple.  Cardiovascular: Normal rate and regular rhythm.   Pulmonary/Chest: Effort normal and breath sounds normal.  Abdominal: Soft. Bowel sounds are normal.  Genitourinary:  Genitourinary Comments: Tender excoriation on shaft of penis approximately 1 cm in diameter  Musculoskeletal: Normal range of motion.  Neurological: He is alert and oriented to person, place, and time.  Skin:  No involvement of web spaces of fingers. Both feet show macerated excoriated tissue between the toes  Psychiatric: He has a normal mood and affect. His behavior is normal.  Nursing note and vitals reviewed.    ED Treatments / Results  Labs (all labs ordered are listed, but only abnormal  results are displayed) Labs Reviewed  CBC WITH DIFFERENTIAL/PLATELET - Abnormal; Notable for the following:       Result Value   Platelets 134 (*)    All other components within normal limits  COMPREHENSIVE METABOLIC PANEL - Abnormal; Notable for the following:    Potassium 3.4 (*)    Glucose, Bld 123 (*)    AST 60 (*)    ALT 98 (*)    All other components within normal limits  URINALYSIS, ROUTINE W REFLEX MICROSCOPIC (NOT AT [facility]) - Abnormal; Notable for the following:    Hgb urine dipstick TRACE (*)    All other components within normal limits    URINE MICROSCOPIC-ADD ON    EKG  EKG Interpretation None       Radiology No results found.  Procedures Procedures (including critical care time)  Medications Ordered in ED Medications - No data to display   Initial Impression / Assessment and Plan / ED Course  I have reviewed the triage vital signs and the nursing notes.  Pertinent labs & imaging results that were available during my care of the patient were reviewed by me and considered in my medical decision making (see chart for details).  Clinical Course    I suspect this is tinea pedis and corporis. Patient is not sexually active. I considered scabies, but there is no involvement of the web space of the hand. Discussed the elevated liver functions. Rx Lotrisone ointment. He will need primary care follow-up.  Final Clinical Impressions(s) / ED Diagnoses   Final diagnoses:  Tinea pedis of both feet  Tinea corporis  Elevated liver enzymes    New Prescriptions New Prescriptions   CLOTRIMAZOLE-BETAMETHASONE (LOTRISONE) CREAM    Apply to affected area 2 times daily prn     Donnetta HutchingBrian Mattea Seger, MD 11/16/15 1457    Donnetta HutchingBrian Fusako Tanabe, MD 11/16/15 [phone]

## 2016-03-10 ENCOUNTER — Emergency Department (HOSPITAL_COMMUNITY)
Admission: EM | Admit: 2016-03-10 | Discharge: 2016-03-10 | Disposition: A | Discharge status: Home / Self Care | Payer: Medicaid Other | Attending: Emergency Medicine | Admitting: Emergency Medicine

## 2016-03-10 ENCOUNTER — Emergency Department (HOSPITAL_COMMUNITY): Payer: Medicaid Other

## 2016-03-10 ENCOUNTER — Encounter (HOSPITAL_COMMUNITY): Payer: Self-pay | Admitting: Emergency Medicine

## 2016-03-10 DIAGNOSIS — Y9289 Other specified places as the place of occurrence of the external cause: Secondary | ICD-10-CM | POA: Insufficient documentation

## 2016-03-10 DIAGNOSIS — S4992XA Unspecified injury of left shoulder and upper arm, initial encounter: Secondary | ICD-10-CM | POA: Insufficient documentation

## 2016-03-10 DIAGNOSIS — Y9389 Activity, other specified: Secondary | ICD-10-CM | POA: Insufficient documentation

## 2016-03-10 DIAGNOSIS — W230XXA Caught, crushed, jammed, or pinched between moving objects, initial encounter: Secondary | ICD-10-CM | POA: Insufficient documentation

## 2016-03-10 DIAGNOSIS — Y999 Unspecified external cause status: Secondary | ICD-10-CM | POA: Insufficient documentation

## 2016-03-10 DIAGNOSIS — F17210 Nicotine dependence, cigarettes, uncomplicated: Secondary | ICD-10-CM | POA: Insufficient documentation

## 2016-03-10 MED ORDER — HYDROCODONE-ACETAMINOPHEN 5-325 MG PO TABS: 1.0000 | ORAL_TABLET | Freq: Four times a day (QID) | ORAL | 0 refills | Status: DC | PRN

## 2016-03-10 MED ORDER — IBUPROFEN 600 MG PO TABS: 600.0000 mg | ORAL_TABLET | Freq: Four times a day (QID) | ORAL | 0 refills | Status: DC | PRN

## 2016-03-10 MED ORDER — OXYCODONE-ACETAMINOPHEN 5-325 MG PO TABS
1.0000 | ORAL_TABLET | Freq: Once | ORAL | Status: AC
  Administered 2016-03-10: 1 via ORAL
  Filled 2016-03-10: qty 1

## 2016-03-10 NOTE — ED Triage Notes (Signed)
Pt. Will need the translator machine.

## 2016-03-10 NOTE — ED Provider Notes (Signed)
[facility] Provider Note    By signing my name below, I, Earmon PhoenixJennifer Waddell, attest that this documentation has been prepared under the direction and in the presence of Felicie Mornavid Lisamarie Coke, FNP. Electronically Signed: Earmon PhoenixJennifer Waddell, ED Scribe. 03/10/16. 7:16 PM.    History   Chief Complaint Chief Complaint  Patient presents with  . Arm Injury    The history is provided by the patient and medical records. A language interpreter was used.    Sean Ingram is a 26 y.o. male who presents to the Emergency Department complaining of left arm pain that began one week ago while at work. He states his LUE, from his shoulder to his wrist, became stuck between a container of frozen chicken and a piece of machinery. He reports associated severe pain. He has taken an unspecified pain medication with minimal relief. Moving the LUE increases his pain. Pt denies alleviating factors. He denies numbness, tingling or weakness of the LUE, bruising, wounds, swelling. He denies any chronic medical problems or any daily medications. He is right hand dominant.   History reviewed. No pertinent past medical history.  Patient Active Problem List   Diagnosis Date Noted  . Appendicitis, acute 09/13/2012    Past Surgical History:  Procedure Laterality Date  . APPENDECTOMY  09/12/2012  . LAPAROSCOPIC APPENDECTOMY N/A 09/13/2012   Procedure: APPENDECTOMY LAPAROSCOPIC;  Surgeon: Liz MaladyBurke E Thompson, MD;  [facility];  Service: General;  Laterality: N/A;       Home Medications    Prior to Admission medications   Medication Sig Start Date End Date Taking? Authorizing Provider  clindamycin (CLEOCIN) 150 MG capsule Take 2 capsules (300 mg total) by mouth 4 (four) times daily. 11/29/13   Gilda Creasehristopher J Pollina, MD  clotrimazole-betamethasone (LOTRISONE) cream Apply to affected area 2 times daily prn 11/16/15   Donnetta HutchingBrian Cook, MD  HYDROcodone-acetaminophen (NORCO/VICODIN) 5-325 MG per tablet Take 1-2 tablets by  mouth every 4 (four) hours as needed for moderate pain. 11/29/13   Gilda Creasehristopher J Pollina, MD  ibuprofen (ADVIL,MOTRIN) 600 MG tablet Take 1 tablet (600 mg total) by mouth every 6 (six) hours as needed. 09/12/15   Jacalyn LefevreJulie Haviland, MD  promethazine (PHENERGAN) 25 MG tablet Take 1 tablet (25 mg total) by mouth every 6 (six) hours as needed for nausea or vomiting. 09/12/15   Jacalyn LefevreJulie Haviland, MD    Family History No family history on file.  Social History Social History  Substance Use Topics  . Smoking status: Current Every Day Smoker    Packs/day: 0.50    Types: Cigarettes  . Smokeless tobacco: Never Used  . Alcohol use Yes     Allergies   Patient has no known allergies.   Review of Systems Review of Systems  Musculoskeletal: Positive for arthralgias and myalgias.  Skin: Negative for color change and wound.  Neurological: Negative for weakness and numbness.  All other systems reviewed and are negative.    Physical Exam Updated Vital Signs BP 135/84 (BP Location: Right Arm)   Pulse 96   Temp 98.3 F (36.8 C) (Oral)   Resp 17   Wt 169 lb 3 oz (76.7 kg)   SpO2 99%   BMI 27.31 kg/m   Physical Exam  Constitutional: He is oriented to person, place, and time. He appears well-developed and well-nourished.  HENT:  Head: Normocephalic and atraumatic.  Neck: Normal range of motion.  Cardiovascular: Normal rate, regular rhythm and normal heart sounds.  Exam reveals no gallop and no friction rub.  No murmur heard. Brisk cap refill of LUE. Strong radial pulse of LUE.  Pulmonary/Chest: Effort normal and breath sounds normal. No respiratory distress. He has no wheezes. He has no rales.  Musculoskeletal: He exhibits tenderness. He exhibits no edema or deformity.  Good ROM of left shoulder. Reports pain to wrist, forearm and elbow. Able to move LUE reluctantly. No obvious deformity.  Neurological: He is alert and oriented to person, place, and time.  Skin: Skin is warm and dry.    Psychiatric: He has a normal mood and affect. His behavior is normal.  Nursing note and vitals reviewed.    ED Treatments / Results  DIAGNOSTIC STUDIES: Oxygen Saturation is 99% on RA, normal by my interpretation.   COORDINATION OF CARE: 5:50 PM- Will X-Ray left elbow, forearm and wrist. Pt verbalizes understanding and agrees to plan.  Medications  oxyCODONE-acetaminophen (PERCOCET/ROXICET) 5-325 MG per tablet 1 tablet (not administered)    Labs (all labs ordered are listed, but only abnormal results are displayed) Labs Reviewed - No data to display  EKG  EKG Interpretation None       Radiology Dg Forearm Left  Result Date: 03/10/2016 CLINICAL DATA:  Crush injury to all arm. EXAM: LEFT FOREARM - 2 VIEW COMPARISON:  None. FINDINGS: No evidence of fracture involving the radius or ulna. No worrisome lytic or sclerotic osseous abnormality. IMPRESSION: Negative. Electronically Signed   By: Kennith Center M.D.   On: 03/10/2016 18:50   Dg Humerus Left  Result Date: 03/10/2016 CLINICAL DATA:  Crush injury to all arm. EXAM: LEFT HUMERUS - 2+ VIEW COMPARISON:  None. FINDINGS: There is no evidence of fracture or other focal bone lesions. Soft tissues are unremarkable. IMPRESSION: Negative. Note, if there is clinical concern for injury at the elbow, dedicated elbow exam recommended. Electronically Signed   By: Kennith Center M.D.   On: 03/10/2016 18:51    Procedures Procedures (including critical care time)  Medications Ordered in ED Medications  oxyCODONE-acetaminophen (PERCOCET/ROXICET) 5-325 MG per tablet 1 tablet (not administered)     Initial Impression / Assessment and Plan / ED Course  I have reviewed the triage vital signs and the nursing notes.  Pertinent labs & imaging results that were available during my care of the patient were reviewed by me and considered in my medical decision making (see chart for details).     Patient X-Ray negative for obvious fracture or  dislocation. Pt advised to follow up with orthopedics. Patient given sling while in ED, conservative therapy recommended and discussed. Patient will be discharged home & is agreeable with above plan. Returns precautions discussed. Pt appears safe for discharge.  I personally performed the services described in this documentation, which was scribed in my presence. The recorded information has been reviewed and is accurate.   Final Clinical Impressions(s) / ED Diagnoses   Final diagnoses:  Injury of left upper extremity, initial encounter    New Prescriptions Discharge Medication List as of 03/10/2016  7:36 PM       Felicie Morn, NP 03/10/16 2313    Lavera Guise, MD 03/15/16 1213

## 2016-03-10 NOTE — ED Notes (Signed)
Patient transported to X-ray 

## 2016-03-10 NOTE — Progress Notes (Signed)
Orthopedic Tech Progress Note Patient Details:  Sean Ingram 02/11/1991 [id number]  Ortho Devices Type of Ortho Device: Arm sling Ortho Device/Splint Location: LUE Ortho Device/Splint Interventions: Ordered, Application   Sean Ingram, [name] 03/10/2016, 7:28 PM

## 2016-03-10 NOTE — ED Triage Notes (Signed)
Translator stated, Per pt. I hurt my arm working. My arm was between a cold box of chickens and  A table, it was frozen and it was stuck. This happen Jan. 18, 2018. It hurts bad.

## 2016-04-02 ENCOUNTER — Emergency Department (HOSPITAL_COMMUNITY)
Admission: EM | Admit: 2016-04-02 | Discharge: 2016-04-02 | Disposition: A | Discharge status: Home / Self Care | Payer: Self-pay | Attending: Emergency Medicine | Admitting: Emergency Medicine

## 2016-04-02 ENCOUNTER — Emergency Department (HOSPITAL_COMMUNITY): Payer: Self-pay

## 2016-04-02 ENCOUNTER — Encounter (HOSPITAL_COMMUNITY): Payer: Self-pay

## 2016-04-02 DIAGNOSIS — R109 Unspecified abdominal pain: Secondary | ICD-10-CM | POA: Insufficient documentation

## 2016-04-02 DIAGNOSIS — F17210 Nicotine dependence, cigarettes, uncomplicated: Secondary | ICD-10-CM | POA: Insufficient documentation

## 2016-04-02 DIAGNOSIS — K625 Hemorrhage of anus and rectum: Secondary | ICD-10-CM | POA: Insufficient documentation

## 2016-04-02 LAB — URINALYSIS, ROUTINE W REFLEX MICROSCOPIC
BACTERIA UA: NONE SEEN
BILIRUBIN URINE: NEGATIVE
Glucose, UA: NEGATIVE mg/dL
Ketones, ur: NEGATIVE mg/dL
NITRITE: NEGATIVE
PH: 6 (ref 5.0–8.0)
Protein, ur: NEGATIVE mg/dL
SPECIFIC GRAVITY, URINE: 1.018 (ref 1.005–1.030)
Squamous Epithelial / LPF: NONE SEEN
WBC, UA: NONE SEEN WBC/hpf (ref 0–5)

## 2016-04-02 LAB — COMPREHENSIVE METABOLIC PANEL
ALBUMIN: 4.1 g/dL (ref 3.5–5.0)
ALT: 35 U/L (ref 17–63)
AST: 21 U/L (ref 15–41)
Alkaline Phosphatase: 48 U/L (ref 38–126)
Anion gap: 8 (ref 5–15)
BILIRUBIN TOTAL: 0.8 mg/dL (ref 0.3–1.2)
BUN: 14 mg/dL (ref 6–20)
CALCIUM: 8.9 mg/dL (ref 8.9–10.3)
CO2: 26 mmol/L (ref 22–32)
Chloride: 103 mmol/L (ref 101–111)
Creatinine, Ser: 0.74 mg/dL (ref 0.61–1.24)
GFR calc Af Amer: >60 mL/min (ref >60)
GFR calc non Af Amer: >60 mL/min (ref >60)
GLUCOSE: 112 mg/dL — AB (ref 65–99)
Potassium: 3.3 mmol/L — ABNORMAL LOW (ref 3.5–5.1)
Sodium: 137 mmol/L (ref 135–145)
TOTAL PROTEIN: 6.7 g/dL (ref 6.5–8.1)

## 2016-04-02 LAB — LIPASE, BLOOD: Lipase: 20 U/L (ref 11–51)

## 2016-04-02 LAB — CBC
HEMATOCRIT: 40.6 % (ref 39.0–52.0)
Hemoglobin: 14.2 g/dL (ref 13.0–17.0)
MCH: 30 pg (ref 26.0–34.0)
MCHC: 35 g/dL (ref 30.0–36.0)
MCV: 85.8 fL (ref 78.0–100.0)
Platelets: 154 10*3/uL (ref 150–400)
RBC: 4.73 MIL/uL (ref 4.22–5.81)
RDW: 12.5 % (ref 11.5–15.5)
WBC: 6.7 10*3/uL (ref 4.0–10.5)

## 2016-04-02 LAB — POC OCCULT BLOOD, ED: FECAL OCCULT BLD: NEGATIVE

## 2016-04-02 MED ORDER — IOPAMIDOL (ISOVUE-300) INJECTION 61%
INTRAVENOUS | Status: AC
  Administered 2016-04-02: 100 mL
  Filled 2016-04-02: qty 100

## 2016-04-02 MED ORDER — POTASSIUM CHLORIDE CRYS ER 20 MEQ PO TBCR
40.0000 meq | EXTENDED_RELEASE_TABLET | Freq: Once | ORAL | Status: AC
  Administered 2016-04-02: 40 meq via ORAL
  Filled 2016-04-02: qty 2

## 2016-04-02 NOTE — Discharge Instructions (Signed)
Please make an appointment with Dr. Corinda GublerLebauer for a follow up with gastroenterology. In the meantime, as we discussed, return to the emergency department if you experience weakness, lightheadedness, shortness of breath, chest pain, any worsening of condition or new concerning symptoms.

## 2016-04-02 NOTE — ED Provider Notes (Signed)
[facility] Provider Note   CSN: [id number] Arrival date & time: 04/02/16  1047     History   Chief Complaint Chief Complaint  Patient presents with  . Abdominal Pain    HPI Sean Ingram is a 26 y.o. male with no past medical history presenting with 1 week of bright red blood per rectum every time he has a bowel movement. He also reports distention and abdominal pain and generalized nonspecific. He also endorses rectal pain with defecation. He endorses a decrease in appetite and some nausea but no vomiting. He is eating somewhat less than usual but drinking well. No fever, chills, dysuria, hematuria, lightheadedness, dizziness, shortness of breath, chest pain or any other symptoms.  HPI  History reviewed. No pertinent past medical history.  Patient Active Problem List   Diagnosis Date Noted  . Appendicitis, acute 09/13/2012    Past Surgical History:  Procedure Laterality Date  . APPENDECTOMY  09/12/2012  . LAPAROSCOPIC APPENDECTOMY N/A 09/13/2012   Procedure: APPENDECTOMY LAPAROSCOPIC;  Surgeon: Liz MaladyBurke E Thompson, MD;  [facility];  Service: General;  Laterality: N/A;       Home Medications    Prior to Admission medications   Medication Sig Start Date End Date Taking? Authorizing Provider  HYDROcodone-acetaminophen (NORCO/VICODIN) 5-325 MG tablet Take 1 tablet by mouth every 6 (six) hours as needed for severe pain. 03/10/16  Yes Felicie Mornavid Smith, NP  predniSONE (DELTASONE) 10 MG tablet Take 10 mg by mouth 3 (three) times daily. 03/29/16  Yes Historical Provider, MD  clindamycin (CLEOCIN) 150 MG capsule Take 2 capsules (300 mg total) by mouth 4 (four) times daily. Patient not taking: Reported on 04/02/2016 11/29/13   Gilda Creasehristopher J Pollina, MD  clotrimazole-betamethasone (LOTRISONE) cream Apply to affected area 2 times daily prn Patient not taking: Reported on 04/02/2016 11/16/15   Donnetta HutchingBrian Cook, MD  promethazine (PHENERGAN) 25 MG tablet Take 1 tablet (25 mg total) by  mouth every 6 (six) hours as needed for nausea or vomiting. Patient not taking: Reported on 04/02/2016 09/12/15   Jacalyn LefevreJulie Haviland, MD    Family History No family history on file.  Social History Social History  Substance Use Topics  . Smoking status: Current Every Day Smoker    Packs/day: 0.50    Types: Cigarettes  . Smokeless tobacco: Never Used  . Alcohol use Yes     Allergies   Patient has no known allergies.   Review of Systems Review of Systems  Constitutional: Negative for chills and fever.  HENT: Negative for congestion, ear pain and sore throat.   Eyes: Negative for pain and visual disturbance.  Respiratory: Negative for cough, chest tightness, shortness of breath, wheezing and stridor.   Cardiovascular: Negative for chest pain and palpitations.  Gastrointestinal: Positive for abdominal distention, abdominal pain, anal bleeding, nausea and rectal pain. Negative for diarrhea and vomiting.  Genitourinary: Negative for decreased urine volume, difficulty urinating, dysuria, flank pain and hematuria.  Musculoskeletal: Negative for arthralgias, back pain, neck pain and neck stiffness.  Skin: Negative for color change, pallor and rash.  Neurological: Negative for dizziness, seizures, syncope, light-headedness and headaches.  All other systems reviewed and are negative.    Physical Exam Updated Vital Signs BP 103/56   Pulse 72   Temp 98.4 F (36.9 C) (Oral)   Resp 16   SpO2 100%   Physical Exam  Constitutional: He appears well-developed and well-nourished. No distress.  Patient is afebrile, nontoxic-appearing, sitting comfortably in bed in no acute distress.  HENT:  Head: Normocephalic and atraumatic.  Mouth/Throat: No oropharyngeal exudate.  Eyes: Conjunctivae and EOM are normal. Pupils are equal, round, and reactive to light. Right eye exhibits no discharge. Left eye exhibits no discharge. No scleral icterus.  Neck: Normal range of motion. Neck supple.    Cardiovascular: Normal rate, regular rhythm, normal heart sounds and intact distal pulses.   No murmur heard. Pulmonary/Chest: Effort normal and breath sounds normal. No respiratory distress. He has no wheezes. He has no rales. He exhibits no tenderness.  Abdominal: Soft. Bowel sounds are normal. He exhibits distension. He exhibits no mass. There is no tenderness. There is no rebound and no guarding.  Genitourinary: Rectal exam shows guaiac negative stool.  Genitourinary Comments: No evidence of fissure, hemorrhoids or gross blood on exam. Good rectal tone  Musculoskeletal: He exhibits no edema.  Neurological: He is alert.  Skin: Skin is warm and dry. No rash noted. He is not diaphoretic. No pallor.  Psychiatric: He has a normal mood and affect. His behavior is normal.  Nursing note and vitals reviewed.    ED Treatments / Results  Labs (all labs ordered are listed, but only abnormal results are displayed) Labs Reviewed  COMPREHENSIVE METABOLIC PANEL - Abnormal; Notable for the following:       Result Value   Potassium 3.3 (*)    Glucose, Bld 112 (*)    All other components within normal limits  URINALYSIS, ROUTINE W REFLEX MICROSCOPIC - Abnormal; Notable for the following:    Hgb urine dipstick SMALL (*)    Leukocytes, UA TRACE (*)    All other components within normal limits  LIPASE, BLOOD  CBC  POC OCCULT BLOOD, ED    EKG  EKG Interpretation None       Radiology Ct Abdomen Pelvis W Contrast  Result Date: 04/02/2016 CLINICAL DATA:  Patient with lower abdominal pain and rectal bleeding for multiple days. EXAM: CT ABDOMEN AND PELVIS WITH CONTRAST TECHNIQUE: Multidetector CT imaging of the abdomen and pelvis was performed using the standard protocol following bolus administration of intravenous contrast. CONTRAST:  ISOVUE-300 IOPAMIDOL (ISOVUE-300) INJECTION 61% COMPARISON:  Testicular ultrasound 12/30/2013. FINDINGS: Lower chest: Normal heart size. Dependent  atelectasis within the bilateral lower lobes. No pleural effusion. Hepatobiliary: The liver is normal in size and contour. Phase of contrast enhancement limits evaluation. Gallbladder is unremarkable. No intrahepatic or extrahepatic biliary ductal dilatation. Pancreas: Unremarkable Spleen: Unremarkable Adrenals/Urinary Tract: Normal adrenal glands. Kidneys enhance symmetrically with contrast. No hydronephrosis. Urinary bladder is unremarkable. Stomach/Bowel: No abnormal bowel wall thickening or evidence for bowel obstruction. No free fluid or free intraperitoneal air. Normal morphology of the stomach. Prior appendectomy. Vascular/Lymphatic: Normal caliber abdominal aorta. No retroperitoneal lymphadenopathy. Reproductive: Prostate is heterogeneous in enhancement. 13 mm cyst within the midline of the prostate. Other: No abdominal wall hernia. Minimal nonspecific free fluid right lower quadrant. Musculoskeletal: No aggressive or acute appearing osseous lesions. IMPRESSION: No acute process within the abdomen or pelvis. Minimal nonspecific fluid within the right lower quadrant. Electronically Signed   By: Annia Belt M.D.   On: 04/02/2016 15:37    Procedures Procedures (including critical care time)  Medications Ordered in ED Medications  iopamidol (ISOVUE-300) 61 % injection (100 mLs  Contrast Given 04/02/16 1515)  potassium chloride SA (K-DUR,KLOR-CON) CR tablet 40 mEq (40 mEq Oral Given 04/02/16 1708)     Initial Impression / Assessment and Plan / ED Course  I have reviewed the triage vital signs and the nursing notes.  Pertinent labs & imaging results that were available during my care of the patient were reviewed by me and considered in my medical decision making (see chart for details).   Otherwise healthy 26 year old male presenting with bright red blood per rectum for one week accompanied by abdominal pain diffusely. On exam he has no focal tenderness, rebound tenderness, negative Murphy sign,  negative McBurney sign (patient has had an appendectomy).  He explains that every time he goes to the bathroom for a bowel movement the toilet gets filled with blood. He generally doesn't feel well.   Exam otherwise unremarkable, patient is afebrile and well-appearing.  Labs unremarkable, hemoglobin 14.2. K+ 3.3, patient given . Hemoccult negative Ct: without any signs of acute pathology.  Discharge home with close follow up with Gastroenterology. Patient had a reassuring exam. He was hemodynamically stable and well-appearing.  Discussed strict return precautions. Patient was advised to return to the emergency department if experiencing any new or worsening symptoms. Patient clearly understood instructions and agreed with discharge plan.  Patient was discussed with Dr. Ethelda Chick who agrees with assessment and plan.  Final Clinical Impressions(s) / ED Diagnoses   Final diagnoses:  Rectal bleeding    New Prescriptions Discharge Medication List as of 04/02/2016  4:48 PM       Georgiana Shore, PA-C 04/02/16 2019    Doug Sou, MD 04/03/16 1705

## 2016-04-02 NOTE — ED Notes (Signed)
Patient made aware urine needed.

## 2016-04-02 NOTE — ED Notes (Signed)
PA at bedside,  

## 2016-04-02 NOTE — ED Triage Notes (Signed)
Patient complains of 1 week of generalized abdominal pain with dark colored loose stools, nol vomiting. Reports decreased appetite

## 2017-04-25 ENCOUNTER — Encounter (HOSPITAL_COMMUNITY): Payer: Self-pay

## 2017-04-25 ENCOUNTER — Emergency Department (HOSPITAL_COMMUNITY)
Admission: EM | Admit: 2017-04-25 | Discharge: 2017-04-25 | Disposition: A | Discharge status: Home / Self Care | Payer: Self-pay | Attending: Emergency Medicine | Admitting: Emergency Medicine

## 2017-04-25 ENCOUNTER — Other Ambulatory Visit: Payer: Self-pay

## 2017-04-25 DIAGNOSIS — L299 Pruritus, unspecified: Secondary | ICD-10-CM | POA: Insufficient documentation

## 2017-04-25 DIAGNOSIS — K5900 Constipation, unspecified: Secondary | ICD-10-CM | POA: Insufficient documentation

## 2017-04-25 DIAGNOSIS — F17210 Nicotine dependence, cigarettes, uncomplicated: Secondary | ICD-10-CM | POA: Insufficient documentation

## 2017-04-25 DIAGNOSIS — R112 Nausea with vomiting, unspecified: Secondary | ICD-10-CM | POA: Insufficient documentation

## 2017-04-25 DIAGNOSIS — R1084 Generalized abdominal pain: Secondary | ICD-10-CM | POA: Insufficient documentation

## 2017-04-25 DIAGNOSIS — K648 Other hemorrhoids: Secondary | ICD-10-CM | POA: Insufficient documentation

## 2017-04-25 LAB — TYPE AND SCREEN
ABO/RH(D): O POS
ANTIBODY SCREEN: NEGATIVE

## 2017-04-25 LAB — LIPASE, BLOOD: Lipase: 20 U/L (ref 11–51)

## 2017-04-25 LAB — CBC
HCT: 35.6 % — ABNORMAL LOW (ref 39.0–52.0)
Hemoglobin: 12.3 g/dL — ABNORMAL LOW (ref 13.0–17.0)
MCH: 29.8 pg (ref 26.0–34.0)
MCHC: 34.6 g/dL (ref 30.0–36.0)
MCV: 86.2 fL (ref 78.0–100.0)
PLATELETS: 142 10*3/uL — AB (ref 150–400)
RBC: 4.13 MIL/uL — AB (ref 4.22–5.81)
RDW: 12.2 % (ref 11.5–15.5)
WBC: 9 10*3/uL (ref 4.0–10.5)

## 2017-04-25 LAB — COMPREHENSIVE METABOLIC PANEL
ALK PHOS: 40 U/L (ref 38–126)
ALT: 24 U/L (ref 17–63)
ANION GAP: 7 (ref 5–15)
AST: 21 U/L (ref 15–41)
Albumin: 4 g/dL (ref 3.5–5.0)
BUN: 26 mg/dL — ABNORMAL HIGH (ref 6–20)
CALCIUM: 8.7 mg/dL — AB (ref 8.9–10.3)
CO2: 28 mmol/L (ref 22–32)
CREATININE: 0.67 mg/dL (ref 0.61–1.24)
Chloride: 105 mmol/L (ref 101–111)
Glucose, Bld: 117 mg/dL — ABNORMAL HIGH (ref 65–99)
Potassium: 4 mmol/L (ref 3.5–5.1)
Sodium: 140 mmol/L (ref 135–145)
TOTAL PROTEIN: 6.7 g/dL (ref 6.5–8.1)
Total Bilirubin: 0.3 mg/dL (ref 0.3–1.2)

## 2017-04-25 LAB — ABO/RH: ABO/RH(D): O POS

## 2017-04-25 LAB — POC OCCULT BLOOD, ED: FECAL OCCULT BLD: POSITIVE — AB

## 2017-04-25 MED ORDER — POLYETHYLENE GLYCOL 3350 17 G PO PACK: 17.0000 g | PACK | Freq: Every day | ORAL | 0 refills | Status: DC

## 2017-04-25 MED ORDER — CLOTRIMAZOLE 1 % EX CREA: TOPICAL_CREAM | CUTANEOUS | 0 refills | Status: DC

## 2017-04-25 MED ORDER — ONDANSETRON 4 MG PO TBDP
4.0000 mg | ORAL_TABLET | Freq: Once | ORAL | Status: AC | PRN
  Administered 2017-04-25: 4 mg via ORAL
  Filled 2017-04-25: qty 1

## 2017-04-25 MED ORDER — GI COCKTAIL ~~LOC~~
30.0000 mL | Freq: Once | ORAL | Status: AC
  Administered 2017-04-25: 30 mL via ORAL
  Filled 2017-04-25: qty 30

## 2017-04-25 NOTE — ED Triage Notes (Signed)
Patient c/o mid abdominal pain, N/V/D x 4-5 days. Patient states he has been having black BM's.

## 2017-04-25 NOTE — ED Notes (Signed)
Barrister's clerkAudio Interpreter for [employer] Pepsiigrinya language used for triage.

## 2017-04-25 NOTE — ED Notes (Signed)
ED Provider at bedside. Interpreter being used for communication.

## 2017-04-25 NOTE — ED Provider Notes (Signed)
[facility] Provider Note  CSN: [id number] Arrival date & time: 04/25/17 [id number]  Chief Complaint(s) Abdominal Pain; Emesis; Diarrhea; and GI Bleeding  HPI Sean Ingram is a 27 y.o. male   The history is provided by the patient. The history is limited by a language barrier. A language interpreter was used.  Abdominal Pain   This is a recurrent problem. The current episode started 2 days ago. The problem occurs constantly. The problem has not changed since onset.The pain is associated with an unknown factor. The pain is located in the generalized abdominal region. The quality of the pain is aching. The pain is moderate. Associated symptoms include diarrhea, hematochezia, melena, nausea, vomiting and constipation. Pertinent negatives include fever and dysuria. The symptoms are aggravated by eating. Nothing relieves the symptoms.   Reports that he has anal itching. Patient denies any suspicious food intake, recent travel, recent antibiotics.  Endorses possible sick contacts with flulike symptoms.   Past Medical History History reviewed. No pertinent past medical history. Patient Active Problem List   Diagnosis Date Noted  . Appendicitis, acute 09/13/2012   Home Medication(s) Prior to Admission medications   Medication Sig Start Date End Date Taking? Authorizing Provider  clindamycin (CLEOCIN) 150 MG capsule Take 2 capsules (300 mg total) by mouth 4 (four) times daily. Patient not taking: Reported on 04/02/2016 11/29/13   Gilda CreasePollina, Christopher J, MD  clotrimazole (LOTRIMIN) 1 % cream Apply to affected area 2 times daily 04/25/17   Ashaya Raftery, Amadeo GarnetPedro Eduardo, MD  clotrimazole-betamethasone (LOTRISONE) cream Apply to affected area 2 times daily prn Patient not taking: Reported on 04/02/2016 11/16/15   Donnetta Hutchingook, Brian, MD  HYDROcodone-acetaminophen (NORCO/VICODIN) 5-325 MG tablet Take 1 tablet by mouth every 6 (six) hours as needed for severe pain. Patient not  taking: Reported on 04/25/2017 03/10/16   Felicie MornSmith, David, NP  polyethylene glycol [facility](MIRALAX) packet Take 17 g by mouth daily. 04/25/17   Nira Connardama, Kayman Snuffer Eduardo, MD  promethazine (PHENERGAN) 25 MG tablet Take 1 tablet (25 mg total) by mouth every 6 (six) hours as needed for nausea or vomiting. Patient not taking: Reported on 04/02/2016 09/12/15   Jacalyn LefevreHaviland, Julie, MD                                                                                                                                    Past Surgical History Past Surgical History:  Procedure Laterality Date  . APPENDECTOMY  09/12/2012  . LAPAROSCOPIC APPENDECTOMY N/A 09/13/2012   Procedure: APPENDECTOMY LAPAROSCOPIC;  Surgeon: Liz MaladyBurke E Thompson, MD;  [facility];  Service: General;  Laterality: N/A;   Family History Family History  Problem Relation Age of Onset  . Diabetes Mother     Social History Social History   Tobacco Use  . Smoking status: Current Every Day Smoker    Packs/day: 0.50    Types: Cigarettes  . Smokeless tobacco: Never Used  Substance Use  Topics  . Alcohol use: Yes    Comment: occasionally  . Drug use: No   Allergies Patient has no known allergies.  Review of Systems Review of Systems  Constitutional: Negative for fever.  Gastrointestinal: Positive for abdominal pain, constipation, diarrhea, hematochezia, melena, nausea and vomiting.  Genitourinary: Negative for dysuria.   All other systems are reviewed and are negative for acute change except as noted in the HPI  Physical Exam Vital Signs  I have reviewed the triage vital signs BP 115/78 (BP Location: Right Arm)   Pulse 78   Temp 98.2 F (36.8 C) (Oral)   Resp 16   Wt 62 kg (136 lb 11 oz)   SpO2 100%   BMI 22.06 kg/m   Physical Exam  Constitutional: He is oriented to person, place, and time. He appears well-developed and well-nourished. No distress.  HENT:  Head: Normocephalic and atraumatic.  Nose: Nose normal.  Eyes: Conjunctivae and  EOM are normal. Pupils are equal, round, and reactive to light. Right eye exhibits no discharge. Left eye exhibits no discharge. No scleral icterus.  Neck: Normal range of motion. Neck supple.  Cardiovascular: Normal rate and regular rhythm. Exam reveals no gallop and no friction rub.  No murmur heard. Pulmonary/Chest: Effort normal and breath sounds normal. No stridor. No respiratory distress. He has no rales.  Abdominal: Soft. He exhibits no distension. There is no tenderness (generalized discomfort.). There is no rigidity, no rebound, no guarding, no tenderness at McBurney's point and negative Murphy's sign. No hernia.  Genitourinary: Rectal exam shows external hemorrhoid. Rectal exam shows no fissure.  Musculoskeletal: He exhibits no edema or tenderness.  Neurological: He is alert and oriented to person, place, and time.  Skin: Skin is warm and dry. No rash noted. He is not diaphoretic. No erythema.  Psychiatric: He has a normal mood and affect.  Vitals reviewed.   ED Results and Treatments Labs (all labs ordered are listed, but only abnormal results are displayed) Labs Reviewed  COMPREHENSIVE METABOLIC PANEL - Abnormal; Notable for the following components:      Result Value   Glucose, Bld 117 (*)    BUN 26 (*)    Calcium 8.7 (*)    All other components within normal limits  CBC - Abnormal; Notable for the following components:   RBC 4.13 (*)    Hemoglobin 12.3 (*)    HCT 35.6 (*)    Platelets 142 (*)    All other components within normal limits  POC OCCULT BLOOD, ED - Abnormal; Notable for the following components:   Fecal Occult Bld POSITIVE (*)    All other components within normal limits  LIPASE, BLOOD  TYPE AND SCREEN  ABO/RH                                                                                                                         EKG  EKG Interpretation  Date/Time:    Ventricular Rate:    PR  Interval:    QRS Duration:   QT Interval:    QTC  Calculation:   R Axis:     Text Interpretation:        Radiology No results found. Pertinent labs & imaging results that were available during my care of the patient were reviewed by me and considered in my medical decision making (see chart for details).  Medications Ordered in ED Medications  ondansetron (ZOFRAN-ODT) disintegrating tablet 4 mg (4 mg Oral Given 04/25/17 0816)  gi cocktail (Maalox,Lidocaine,Donnatal) (30 mLs Oral Given 04/25/17 4098)                                                                                                                                    Procedures Procedures  (including critical care time)  Medical Decision Making / ED Course I have reviewed the nursing notes for this encounter and the patient's prior records (if available in EHR or on provided paperwork).    Labs grossly reassuring without leukocytosis, evidence of biliary obstruction or pancreatitis.  Abdomen with mild discomfort but no evidence of peritonitis requiring imaging.  Hemoccult was positive but stool was not melenic; dark brown stool.  Hemoglobin stable. No active bleeding.  Patient provided with GI cocktail resulting in significant improvement in his abdominal discomfort.  Low suspicion for serious intra-abdominal inflammatory/infectious process.   Patient also mentioned that he still has itching in his feet. Consistent with tinea pedis.    Final Clinical Impression(s) / ED Diagnoses Final diagnoses:  Generalized abdominal pain  Other hemorrhoids   Disposition: Discharge  Condition: Good  I have discussed the results, Dx and Tx plan with the patient who expressed understanding and agree(s) with the plan. Discharge instructions discussed at great length. The patient was given strict return precautions who verbalized understanding of the instructions. No further questions at time of discharge.    ED Discharge Orders        Ordered    polyethylene glycol (MIRALAX) packet   Daily     04/25/17 1124    clotrimazole (LOTRIMIN) 1 % cream     04/25/17 1124       Follow Up: Primary care provider  Call  If you do not have a primary care physician, contact [facility] at [phone] for referral  [facility] [address] [place] [address] [phone] Schedule an appointment as soon as possible for a visit  For close follow up to assess for      This chart was dictated using voice recognition software.  Despite best efforts to proofread,  errors can occur which can change the documentation meaning.   Nira Conn, MD 04/25/17 1125

## 2017-04-25 NOTE — ED Triage Notes (Signed)
Pt presents from home via EMS with c/o abdominal pain. Pt c/o n/v/d for the past 2 days. IV, 20g left hand placed by EMS, 4mg  zofran given en route.

## 2017-04-25 NOTE — ED Notes (Signed)
Medical interpreter used for discharge instructions

## 2017-05-27 ENCOUNTER — Emergency Department (HOSPITAL_COMMUNITY)
Admission: EM | Admit: 2017-05-27 | Discharge: 2017-05-27 | Disposition: A | Discharge status: Home / Self Care | Payer: Self-pay | Attending: Physician Assistant | Admitting: Physician Assistant

## 2017-05-27 ENCOUNTER — Encounter (HOSPITAL_COMMUNITY): Payer: Self-pay | Admitting: Emergency Medicine

## 2017-05-27 DIAGNOSIS — B349 Viral infection, unspecified: Secondary | ICD-10-CM | POA: Insufficient documentation

## 2017-05-27 DIAGNOSIS — F17210 Nicotine dependence, cigarettes, uncomplicated: Secondary | ICD-10-CM | POA: Insufficient documentation

## 2017-05-27 DIAGNOSIS — J029 Acute pharyngitis, unspecified: Secondary | ICD-10-CM | POA: Insufficient documentation

## 2017-05-27 LAB — GROUP A STREP BY PCR: Group A Strep by PCR: NOT DETECTED

## 2017-05-27 MED ORDER — LIDOCAINE VISCOUS 2 % MT SOLN: 15.0000 mL | OROMUCOSAL | 0 refills | Status: DC | PRN

## 2017-05-27 MED ORDER — NAPROXEN 500 MG PO TABS: 500.0000 mg | ORAL_TABLET | Freq: Two times a day (BID) | ORAL | 0 refills | Status: DC

## 2017-05-27 MED ORDER — DEXAMETHASONE SODIUM PHOSPHATE 10 MG/ML IJ SOLN
10.0000 mg | Freq: Once | INTRAMUSCULAR | Status: AC
  Administered 2017-05-27: 10 mg via INTRAMUSCULAR
  Filled 2017-05-27: qty 1

## 2017-05-27 NOTE — ED Provider Notes (Signed)
[facility]-EMERGENCY DEPT Provider Note   CSN: [id number] Arrival date & time: 05/27/17  0757     History   Chief Complaint Chief Complaint  Patient presents with  . Neck Pain  . Otalgia    HPI Sean Ingram is a 27 y.o. male with no significant past medical history, who presents to ED for evaluation of left-sided ear pain, sore throat for the past 4 days.  He has not had any medications prior to arrival to help with symptoms.  He states that the sore throat is worse with eating and drinking.  No sick contacts with similar symptoms.  Denies any fever, drooling, trismus, vomiting, nausea, abdominal pain, shortness of breath, drainage from the ear.  HPI  History reviewed. No pertinent past medical history.  Patient Active Problem List   Diagnosis Date Noted  . Appendicitis, acute 09/13/2012    Past Surgical History:  Procedure Laterality Date  . APPENDECTOMY  09/12/2012  . LAPAROSCOPIC APPENDECTOMY N/A 09/13/2012   Procedure: APPENDECTOMY LAPAROSCOPIC;  Surgeon: Liz Malady, MD;  Location: [facility] OR;  Service: General;  Laterality: N/A;        Home Medications    Prior to Admission medications   Medication Sig Start Date End Date Taking? Authorizing Provider  clindamycin (CLEOCIN) 150 MG capsule Take 2 capsules (300 mg total) by mouth 4 (four) times daily. Patient not taking: Reported on 04/02/2016 11/29/13   Gilda Crease, MD  clotrimazole (LOTRIMIN) 1 % cream Apply to affected area 2 times daily 04/25/17   Cardama, Amadeo Garnet, MD  clotrimazole-betamethasone (LOTRISONE) cream Apply to affected area 2 times daily prn Patient not taking: Reported on 04/02/2016 11/16/15   Donnetta Hutching, MD  HYDROcodone-acetaminophen (NORCO/VICODIN) 5-325 MG tablet Take 1 tablet by mouth every 6 (six) hours as needed for severe pain. Patient not taking: Reported on 04/25/2017 03/10/16   Felicie Morn, NP  lidocaine (XYLOCAINE) 2 % solution Use as directed 15 mLs  in the mouth or throat as needed for mouth pain. 05/27/17   Bradyn Vassey, PA-C  naproxen (NAPROSYN) 500 MG tablet Take 1 tablet (500 mg total) by mouth 2 (two) times daily. 05/27/17   Teyonna Plaisted, PA-C  polyethylene glycol (MIRALAX) packet Take 17 g by mouth daily. 04/25/17   Nira Conn, MD  promethazine (PHENERGAN) 25 MG tablet Take 1 tablet (25 mg total) by mouth every 6 (six) hours as needed for nausea or vomiting. Patient not taking: Reported on 04/02/2016 09/12/15   Jacalyn Lefevre, MD    Family History Family History  Problem Relation Age of Onset  . Diabetes Mother     Social History Social History   Tobacco Use  . Smoking status: Current Every Day Smoker    Packs/day: 0.50    Types: Cigarettes  . Smokeless tobacco: Never Used  Substance Use Topics  . Alcohol use: Yes    Comment: occasionally  . Drug use: No     Allergies   Patient has no known allergies.   Review of Systems Review of Systems  Constitutional: Negative for appetite change, chills and fever.  HENT: Positive for ear pain and sore throat. Negative for congestion, dental problem, drooling, ear discharge, mouth sores, rhinorrhea, sneezing and trouble swallowing.   Eyes: Negative for photophobia and visual disturbance.  Respiratory: Negative for cough, chest tightness, shortness of breath and wheezing.   Cardiovascular: Negative for chest pain and palpitations.  Gastrointestinal: Negative for abdominal pain, blood in stool, constipation, diarrhea, nausea  and vomiting.  Genitourinary: Negative for dysuria, hematuria and urgency.  Musculoskeletal: Negative for myalgias, neck pain and neck stiffness.  Skin: Negative for rash.  Neurological: Negative for dizziness, weakness and light-headedness.     Physical Exam Updated Vital Signs BP 132/81   Pulse 76   Temp 98.4 F (36.9 C) (Oral)   Resp 18   Wt 60.4 kg (133 lb 1 oz)   SpO2 100%   BMI 21.48 kg/m   Physical Exam  Constitutional: He  appears well-developed and well-nourished. No distress.  Nontoxic appearing in no acute distress.  Speaking in complete sentences without difficulty.  HENT:  Head: Normocephalic and atraumatic.  Right Ear: A middle ear effusion is present.  Left Ear: A middle ear effusion is present.  Nose: Nose normal.  Mouth/Throat: Uvula is midline. Posterior oropharyngeal erythema present. Tonsils are 1+ on the right. Tonsils are 1+ on the left. No tonsillar exudate.  Patient does not appear to be in acute distress. No trismus or drooling present. No pooling of secretions. Patient is tolerating secretions and is not in respiratory distress. No neck pain or tenderness to palpation of the neck. Full active and passive range of motion of the neck. No evidence of RPA or PTA.  Eyes: Conjunctivae and EOM are normal. Right eye exhibits no discharge. Left eye exhibits no discharge. No scleral icterus.  Neck: Normal range of motion. Neck supple.  No meningismus.  Cardiovascular: Normal rate, regular rhythm, normal heart sounds and intact distal pulses. Exam reveals no gallop and no friction rub.  No murmur heard. Pulmonary/Chest: Effort normal and breath sounds normal. No respiratory distress.  Abdominal: Soft. Bowel sounds are normal. He exhibits no distension. There is no tenderness. There is no guarding.  Musculoskeletal: Normal range of motion. He exhibits no edema.  Neurological: He is alert. He exhibits normal muscle tone. Coordination normal.  Skin: Skin is warm and dry. No rash noted.  Psychiatric: He has a normal mood and affect.  Nursing note and vitals reviewed.    ED Treatments / Results  Labs (all labs ordered are listed, but only abnormal results are displayed) Labs Reviewed  GROUP A STREP BY PCR  CULTURE, GROUP A STREP [facility]    EKG None  Radiology No results found.  Procedures Procedures (including critical care time)  Medications Ordered in ED Medications  dexamethasone  (DECADRON) injection 10 mg (10 mg Intramuscular Given 05/27/17 1014)     Initial Impression / Assessment and Plan / ED Course  I have reviewed the triage vital signs and the nursing notes.  Pertinent labs & imaging results that were available during my care of the patient were reviewed by me and considered in my medical decision making (see chart for details).     Patient presents to ED for evaluation of sore throat and left-sided otalgia for the past 4 days.  Patient reports sore throat worse with eating and drinking.  Denies any fever, drooling, trismus, nausea, vomiting (which contradicts triage note).  On physical exam he is overall well-appearing.  His tonsils are bilaterally, symmetrically enlarged without exudates.  There is no evidence of RPA or PTA on examination.  No evidence of AOM, otitis externa on examination of TMs.  Strep test returned as negative.  Suspect that her symptoms are viral in nature.  Will advise lidocaine to swish and spit, anti-inflammatories.  Patient was given Decadron here to help with discomfort.  Advised to follow-up with PCP for further evaluation if symptoms persist.  Strict return precautions given.  Portions of this note were generated with Scientist, clinical (histocompatibility and immunogenetics). Dictation errors may occur despite best attempts at proofreading.   Final Clinical Impressions(s) / ED Diagnoses   Final diagnoses:  Viral pharyngitis    ED Discharge Orders        Ordered    lidocaine (XYLOCAINE) 2 % solution  As needed,   Status:  Discontinued     05/27/17 1100    naproxen (NAPROSYN) 500 MG tablet  2 times daily,   Status:  Discontinued     05/27/17 1100    lidocaine (XYLOCAINE) 2 % solution  As needed,   Status:  Discontinued     05/27/17 1101    naproxen (NAPROSYN) 500 MG tablet  2 times daily,   Status:  Discontinued     05/27/17 1101    lidocaine (XYLOCAINE) 2 % solution  As needed     05/27/17 1102    naproxen (NAPROSYN) 500 MG tablet  2 times daily      05/27/17 1102       Dietrich Pates, PA-C 05/27/17 1107    Mackuen, Cindee Salt, MD 05/27/17 1317

## 2017-05-27 NOTE — ED Triage Notes (Signed)
Patient here from home with complaints of left ear pain radiating down into neck x4 days. Nausea, vomiting.

## 2017-05-30 LAB — CULTURE, GROUP A STREP (THRC)

## 2017-08-17 ENCOUNTER — Emergency Department (HOSPITAL_COMMUNITY): Admission: EM | Admit: 2017-08-17 | Payer: Self-pay | Source: Home / Self Care

## 2020-03-20 ENCOUNTER — Emergency Department (HOSPITAL_COMMUNITY): Payer: Self-pay

## 2020-03-20 ENCOUNTER — Emergency Department (HOSPITAL_COMMUNITY)
Admission: EM | Admit: 2020-03-20 | Discharge: 2020-03-20 | Disposition: A | Discharge status: Home / Self Care | Payer: Self-pay | Attending: Emergency Medicine | Admitting: Emergency Medicine

## 2020-03-20 ENCOUNTER — Other Ambulatory Visit: Payer: Self-pay

## 2020-03-20 ENCOUNTER — Encounter (HOSPITAL_COMMUNITY): Payer: Self-pay | Admitting: Emergency Medicine

## 2020-03-20 DIAGNOSIS — R110 Nausea: Secondary | ICD-10-CM | POA: Insufficient documentation

## 2020-03-20 DIAGNOSIS — K649 Unspecified hemorrhoids: Secondary | ICD-10-CM

## 2020-03-20 DIAGNOSIS — F17210 Nicotine dependence, cigarettes, uncomplicated: Secondary | ICD-10-CM | POA: Insufficient documentation

## 2020-03-20 DIAGNOSIS — R1084 Generalized abdominal pain: Secondary | ICD-10-CM | POA: Insufficient documentation

## 2020-03-20 DIAGNOSIS — R42 Dizziness and giddiness: Secondary | ICD-10-CM | POA: Insufficient documentation

## 2020-03-20 DIAGNOSIS — K644 Residual hemorrhoidal skin tags: Secondary | ICD-10-CM | POA: Insufficient documentation

## 2020-03-20 DIAGNOSIS — R197 Diarrhea, unspecified: Secondary | ICD-10-CM | POA: Insufficient documentation

## 2020-03-20 LAB — COMPREHENSIVE METABOLIC PANEL
ALT: 31 U/L (ref 0–44)
AST: 22 U/L (ref 15–41)
Albumin: 4.1 g/dL (ref 3.5–5.0)
Alkaline Phosphatase: 55 U/L (ref 38–126)
Anion gap: 13 (ref 5–15)
BUN: 15 mg/dL (ref 6–20)
CO2: 21 mmol/L — ABNORMAL LOW (ref 22–32)
Calcium: 9.1 mg/dL (ref 8.9–10.3)
Chloride: 102 mmol/L (ref 98–111)
Creatinine, Ser: 1.01 mg/dL (ref 0.61–1.24)
GFR, Estimated: >60 mL/min (ref >60)
Glucose, Bld: 131 mg/dL — ABNORMAL HIGH (ref 70–99)
Potassium: 3.3 mmol/L — ABNORMAL LOW (ref 3.5–5.1)
Sodium: 136 mmol/L (ref 135–145)
Total Bilirubin: 1.7 mg/dL — ABNORMAL HIGH (ref 0.3–1.2)
Total Protein: 7.1 g/dL (ref 6.5–8.1)

## 2020-03-20 LAB — CBC
HCT: 42.1 % (ref 39.0–52.0)
Hemoglobin: 15.3 g/dL (ref 13.0–17.0)
MCH: 30.6 pg (ref 26.0–34.0)
MCHC: 36.3 g/dL — ABNORMAL HIGH (ref 30.0–36.0)
MCV: 84.2 fL (ref 80.0–100.0)
Platelets: 157 10*3/uL (ref 150–400)
RBC: 5 MIL/uL (ref 4.22–5.81)
RDW: 12.5 % (ref 11.5–15.5)
WBC: 5.4 10*3/uL (ref 4.0–10.5)
nRBC: 0 % (ref 0.0–0.2)

## 2020-03-20 LAB — URINALYSIS, ROUTINE W REFLEX MICROSCOPIC
Bacteria, UA: NONE SEEN
Glucose, UA: NEGATIVE mg/dL
Hgb urine dipstick: NEGATIVE
Ketones, ur: 5 mg/dL — AB
Leukocytes,Ua: NEGATIVE
Nitrite: NEGATIVE
Protein, ur: 30 mg/dL — AB
Specific Gravity, Urine: 1.036 — ABNORMAL HIGH (ref 1.005–1.030)
pH: 5 (ref 5.0–8.0)

## 2020-03-20 LAB — SAMPLE TO BLOOD BANK

## 2020-03-20 LAB — PROTIME-INR
INR: 1.2 (ref 0.8–1.2)
Prothrombin Time: 15.1 seconds (ref 11.4–15.2)

## 2020-03-20 LAB — POC OCCULT BLOOD, ED: Fecal Occult Bld: NEGATIVE

## 2020-03-20 LAB — LIPASE, BLOOD: Lipase: 20 U/L (ref 11–51)

## 2020-03-20 MED ORDER — SODIUM CHLORIDE 0.9 % IV BOLUS
1000.0000 mL | Freq: Once | INTRAVENOUS | Status: AC
  Administered 2020-03-20: 1000 mL via INTRAVENOUS

## 2020-03-20 MED ORDER — HYDROCORTISONE ACETATE 25 MG RE SUPP: 25.0000 mg | Freq: Two times a day (BID) | RECTAL | 0 refills | Status: DC

## 2020-03-20 MED ORDER — IOHEXOL 300 MG/ML  SOLN
100.0000 mL | Freq: Once | INTRAMUSCULAR | Status: AC | PRN
  Administered 2020-03-20: 100 mL via INTRAVENOUS

## 2020-03-20 MED ORDER — DICYCLOMINE HCL 20 MG PO TABS: 20.0000 mg | ORAL_TABLET | Freq: Two times a day (BID) | ORAL | 0 refills | Status: DC

## 2020-03-20 MED ORDER — ONDANSETRON HCL 4 MG/2ML IJ SOLN
4.0000 mg | Freq: Once | INTRAMUSCULAR | Status: AC
  Administered 2020-03-20: 4 mg via INTRAVENOUS
  Filled 2020-03-20: qty 2

## 2020-03-20 MED ORDER — POTASSIUM CHLORIDE CRYS ER 20 MEQ PO TBCR
40.0000 meq | EXTENDED_RELEASE_TABLET | Freq: Once | ORAL | Status: AC
  Administered 2020-03-20: 40 meq via ORAL
  Filled 2020-03-20: qty 2

## 2020-03-20 MED ORDER — DICYCLOMINE HCL 10 MG/ML IM SOLN
20.0000 mg | Freq: Once | INTRAMUSCULAR | Status: AC
  Administered 2020-03-20: 20 mg via INTRAMUSCULAR
  Filled 2020-03-20: qty 2

## 2020-03-20 MED ORDER — ONDANSETRON 4 MG PO TBDP: ORAL_TABLET | ORAL | 0 refills | Status: DC

## 2020-03-20 NOTE — ED Notes (Signed)
Pt transported to CT ?

## 2020-03-20 NOTE — ED Triage Notes (Signed)
Patient reports generalized abdominal pain with bloody diarrhea onset this week , denies emesis , no fever or chills .

## 2020-03-20 NOTE — Discharge Instructions (Addendum)
Your evaluation today was reassuring. Your CT scan looks good, lab work shows normal blood counts despite rectal bleeding.  Suspect your bleeding is from hemorrhoids, use prescribed suppositories to help with hemorrhoids, you can also do soaks in warm water 2-3 times daily to help with this.  Please follow food recommendations for diarrhea attached your paperwork today.  To help with diarrhea and abdominal pain you can use Zofran for nausea, and Bentyl to help with cramping and pain.  Make sure you are drinking plenty of fluids.  Follow-up with your PCP or GI doctor.  Return for new or worsening symptoms such as fever, increasing amounts of blood in stool, new or worsening abdominal pain, vomiting and unable to tolerate fluids or any other new or concerning symptoms.

## 2020-03-20 NOTE — ED Notes (Signed)
Pt d/c home per MD order. Discharge summary reviewed with pt, pt verbalizes understanding. Ambulatory off unit. No s/s of acute distress noted at discharge.,  °

## 2020-03-20 NOTE — ED Provider Notes (Signed)
[facility] EMERGENCY DEPARTMENT Provider Note   CSN: [id number] Arrival date & time: 03/20/20  9381     History Chief Complaint  Patient presents with  . Abdominal Pain    Bloody Diarrhea    Sean Ingram is a 30 y.o. male.  Sean Ingram is a 30 y.o. male with a history of previous appendicitis, otherwise healthy, who presents to the ED for evaluation of generalized abdominal pain with bloody diarrhea.  Patient reports abdominal pain started about 6 days ago and initially he was having normal diarrhea and nausea without vomiting.  He reports 4 days ago he started noticing some blood in his stool.  He reports it is bright red.  Reports that yesterday he started to feel little lightheaded but did not have any syncopal episodes.  Has not had much of an appetite to eat or drink with the symptoms.  No associated fevers, chills, body aches, chest pain, shortness of breath or cough.  Patient does report that he has had hemorrhoids before but never with this much bleeding, he also reports some rectal pain when having a bowel movement.  No hematemesis.  Not on any blood thinners.  No meds prior to arrival to treat symptoms.  No recent antibiotics, and does not take any daily medications aside from intermittent Tylenol.        History reviewed. No pertinent past medical history.  Patient Active Problem List   Diagnosis Date Noted  . Appendicitis, acute 09/13/2012    Past Surgical History:  Procedure Laterality Date  . APPENDECTOMY  09/12/2012  . LAPAROSCOPIC APPENDECTOMY N/A 09/13/2012   Procedure: APPENDECTOMY LAPAROSCOPIC;  Surgeon: Liz Malady, MD;  Location: [facility] OR;  Service: General;  Laterality: N/A;       Family History  Problem Relation Age of Onset  . Diabetes Mother     Social History   Tobacco Use  . Smoking status: Current Every Day Smoker    Packs/day: 0.50    Types: Cigarettes  . Smokeless tobacco: Never Used  Vaping Use  . Vaping  Use: Never used  Substance Use Topics  . Alcohol use: Yes    Comment: occasionally  . Drug use: No    Home Medications Prior to Admission medications   Medication Sig Start Date End Date Taking? Authorizing Provider  dicyclomine (BENTYL) 20 MG tablet Take 1 tablet (20 mg total) by mouth 2 (two) times daily. 03/20/20  Yes Dartha Lodge, PA-C  ondansetron (ZOFRAN ODT) 4 MG disintegrating tablet 4mg  ODT q4 hours prn nausea/vomit 03/20/20  Yes Syla Devoss N, PA-C  clindamycin (CLEOCIN) 150 MG capsule Take 2 capsules (300 mg total) by mouth 4 (four) times daily. Patient not taking: Reported on 04/02/2016 11/29/13   Gilda Crease, MD  clotrimazole (LOTRIMIN) 1 % cream Apply to affected area 2 times daily 04/25/17   Cardama, Amadeo Garnet, MD  clotrimazole-betamethasone (LOTRISONE) cream Apply to affected area 2 times daily prn Patient not taking: Reported on 04/02/2016 11/16/15   Donnetta Hutching, MD  HYDROcodone-acetaminophen (NORCO/VICODIN) 5-325 MG tablet Take 1 tablet by mouth every 6 (six) hours as needed for severe pain. Patient not taking: Reported on 04/25/2017 03/10/16   Felicie Morn, NP  hydrocortisone (ANUSOL-HC) 25 MG suppository Place 1 suppository (25 mg total) rectally 2 (two) times daily. For 7 days 03/20/20   Dartha Lodge, PA-C  lidocaine (XYLOCAINE) 2 % solution Use as directed 15 mLs in the mouth or throat as needed for  mouth pain. 05/27/17   Khatri, Hina, PA-C  naproxen (NAPROSYN) 500 MG tablet Take 1 tablet (500 mg total) by mouth 2 (two) times daily. 05/27/17   Khatri, Hina, PA-C  polyethylene glycol (MIRALAX) packet Take 17 g by mouth daily. 04/25/17   Nira Conn, MD  promethazine (PHENERGAN) 25 MG tablet Take 1 tablet (25 mg total) by mouth every 6 (six) hours as needed for nausea or vomiting. Patient not taking: Reported on 04/02/2016 09/12/15   Jacalyn Lefevre, MD    Allergies    Patient has no known allergies.  Review of Systems   Review of Systems   Constitutional: Negative for chills and fever.  HENT: Negative.   Respiratory: Negative for cough and shortness of breath.   Cardiovascular: Negative for chest pain.  Gastrointestinal: Positive for abdominal pain, blood in stool, diarrhea and nausea. Negative for constipation and vomiting.  Genitourinary: Negative for dysuria, frequency and hematuria.  Musculoskeletal: Negative for arthralgias and myalgias.  Skin: Negative for color change and rash.  Neurological: Negative for dizziness, syncope and light-headedness.  All other systems reviewed and are negative.   Physical Exam Updated Vital Signs BP 118/83 (BP Location: Left Arm)   Pulse 90   Temp 98.6 F (37 C) (Oral)   Resp 17   Ht 5\' 6"  (1.676 m)   Wt 75 kg   SpO2 100%   BMI 26.69 kg/m   Physical Exam Vitals and nursing note reviewed. Exam conducted with a chaperone present.  Constitutional:      General: He is not in acute distress.    Appearance: He is well-developed, normal weight and well-nourished. He is not diaphoretic.     Comments: Well-appearing and in no distress   HENT:     Head: Normocephalic and atraumatic.     Mouth/Throat:     Mouth: Oropharynx is clear and moist.  Eyes:     General:        Right eye: No discharge.        Left eye: No discharge.     Extraocular Movements: EOM normal.     Pupils: Pupils are equal, round, and reactive to light.  Cardiovascular:     Rate and Rhythm: Normal rate and regular rhythm.     Pulses: Intact distal pulses.     Heart sounds: Normal heart sounds.  Pulmonary:     Effort: Pulmonary effort is normal. No respiratory distress.     Breath sounds: Normal breath sounds. No wheezing or rales.     Comments: Respirations equal and unlabored, patient able to speak in full sentences, lungs clear to auscultation bilaterally  Abdominal:     General: Bowel sounds are normal. There is no distension.     Palpations: Abdomen is soft. There is no mass.     Tenderness: There is  generalized abdominal tenderness. There is no guarding.     Comments: Abdomen is soft, nondistended, bowel sounds present throughout, there is generalized mild abdominal tenderness noted that does not localize to 1 quadrant, no guarding or peritoneal signs present.  Genitourinary:    Comments: Chaperone present during rectal exam, there is one small external nonthrombosed hemorrhoid present, normal rectal tone Palpate suspected internal hemorrhoids, no gross blood on exam, minimal stool in the rectal vault Musculoskeletal:        General: No deformity or edema.     Cervical back: Neck supple.  Skin:    General: Skin is warm and dry.     Capillary Refill:  Capillary refill takes less than 2 seconds.  Neurological:     Mental Status: He is alert.     Coordination: Coordination normal.     Comments: Speech is clear, able to follow commands Moves extremities without ataxia, coordination intact  Psychiatric:        Mood and Affect: Mood normal.        Behavior: Behavior normal.     ED Results / Procedures / Treatments   Labs (all labs ordered are listed, but only abnormal results are displayed) Labs Reviewed  COMPREHENSIVE METABOLIC PANEL - Abnormal; Notable for the following components:      Result Value   Potassium 3.3 (*)    CO2 21 (*)    Glucose, Bld 131 (*)    Total Bilirubin 1.7 (*)    All other components within normal limits  CBC - Abnormal; Notable for the following components:   MCHC 36.3 (*)    All other components within normal limits  URINALYSIS, ROUTINE W REFLEX MICROSCOPIC - Abnormal; Notable for the following components:   Color, Urine AMBER (*)    Specific Gravity, Urine 1.036 (*)    Bilirubin Urine SMALL (*)    Ketones, ur 5 (*)    Protein, ur 30 (*)    All other components within normal limits  GASTROINTESTINAL PANEL BY PCR, STOOL (REPLACES STOOL CULTURE)  LIPASE, BLOOD  PROTIME-INR  POC OCCULT BLOOD, ED  SAMPLE TO BLOOD BANK     EKG None  Radiology CT Abdomen Pelvis W Contrast  Result Date: 03/20/2020 CLINICAL DATA:  Acute nonlocalized abdominal pain, bloody diarrhea, rectal bleeding EXAM: CT ABDOMEN AND PELVIS WITH CONTRAST TECHNIQUE: Multidetector CT imaging of the abdomen and pelvis was performed using the standard protocol following bolus administration of intravenous contrast. CONTRAST:  OMNIPAQUE IOHEXOL 300 MG/ML  SOLN COMPARISON:  04/02/2016 FINDINGS: Lower chest: The visualized lung bases are clear bilaterally. The visualized heart and pericardium are unremarkable. Hepatobiliary: No focal liver abnormality is seen. No gallstones, gallbladder wall thickening, or biliary dilatation. Pancreas: Unremarkable Spleen: Unremarkable Adrenals/Urinary Tract: Adrenal glands are unremarkable. Kidneys are normal, without renal calculi, focal lesion, or hydronephrosis. Bladder is unremarkable. Stomach/Bowel: Status post appendectomy. The stomach, small bowel, and large bowel are unremarkable. No free intraperitoneal gas or fluid. Vascular/Lymphatic: No significant vascular findings are present. No enlarged abdominal or pelvic lymph nodes. Reproductive: Prostate is unremarkable. Other: Tiny broad-based fat containing umbilical hernia. Rectum unremarkable. Musculoskeletal: No acute bone abnormality. No lytic or blastic bone lesion. IMPRESSION: No acute intra-abdominal pathology identified. No radiographic explanation for the patient's reported symptoms. Electronically Signed   By: Helyn Numbers MD   On: 03/20/2020 09:56    Procedures Procedures   Medications Ordered in ED Medications  sodium chloride 0.9 % bolus 1,000 mL (0 mLs Intravenous Stopped 03/20/20 1100)  ondansetron (ZOFRAN) injection 4 mg (4 mg Intravenous Given 03/20/20 0855)  dicyclomine (BENTYL) injection 20 mg (20 mg Intramuscular Given 03/20/20 0856)  potassium chloride SA (KLOR-CON) CR tablet 40 mEq (40 mEq Oral Given 03/20/20 1048)  iohexol (OMNIPAQUE) 300  MG/ML solution 100 mL (100 mLs Intravenous Contrast Given 03/20/20 0949)    ED Course  I have reviewed the triage vital signs and the nursing notes.  Pertinent labs & imaging results that were available during my care of the patient were reviewed by me and considered in my medical decision making (see chart for details).    MDM Rules/Calculators/A&P  Patient presents to the ED with complaints of abdominal pain. Patient nontoxic appearing, in no apparent distress, vitals WNL. On exam patient with some generalized abdominal tenderness that is mild and nonfocal, no peritoneal signs.  On rectal exam patient with one small external hemorrhoid, and likely 2 internal hemorrhoids palpated, but no gross blood noted.  Will evaluate with labs and CT abdomen pelvis.  Analgesics, anti-emetics, and fluids administered.   I have independently ordered, reviewed and interpreted all labs and imaging: CBC: No leukocytosis, despite reported bleeding for the past 4 days patient with hemoglobin of 5.3 which is very reassuring CMP: Mild hypokalemia, likely in the setting of diarrhea, p.o. potassium replacement given, no other significant electrolyte derangements, normal renal function, bili mildly elevated but LFTs otherwise unremarkable and patient without focal right upper quadrant tenderness. Lipase: WNL UA: Some RBCs noted, no signs of infection  Hemoccult: Negative  CT abdomen pelvis is fortunately unremarkable, no acute pathology identified, no signs of colitis.  On repeat abdominal exam patient remains without peritoneal signs, doubt cholecystitis, pancreatitis, diverticulitis, appendicitis, bowel obstruction/perforation. Patient tolerating PO in the emergency department.  Suspect patient likely with gastroenteritis leading to flare of patient's hemorrhoids, will treat with Anusol suppositories and sitz bath's.  Will discharge home with supportive measures.  Provided recommendations for  dietary changes in the setting of diarrhea.  I discussed results, treatment plan, need for PCP follow-up, and return precautions with the patient. Provided opportunity for questions, patient confirmed understanding and is in agreement with plan.     Final Clinical Impression(s) / ED Diagnoses Final diagnoses:  Generalized abdominal pain  Diarrhea, unspecified type  Hemorrhoids, unspecified hemorrhoid type    Rx / DC Orders ED Discharge Orders         Ordered    dicyclomine (BENTYL) 20 MG tablet  2 times daily        03/20/20 1223    ondansetron (ZOFRAN ODT) 4 MG disintegrating tablet        03/20/20 1223    hydrocortisone (ANUSOL-HC) 25 MG suppository  2 times daily,   Status:  Discontinued        03/20/20 1223    hydrocortisone (ANUSOL-HC) 25 MG suppository  2 times daily        03/20/20 1226           Dartha Lodge, [place] 03/20/20 1428    Melene Plan, DO 03/20/20 1544

## 2021-03-01 ENCOUNTER — Ambulatory Visit (INDEPENDENT_AMBULATORY_CARE_PROVIDER_SITE_OTHER): Payer: Self-pay | Admitting: Primary Care

## 2021-05-05 ENCOUNTER — Emergency Department (HOSPITAL_COMMUNITY): Payer: Medicaid Other

## 2021-05-05 ENCOUNTER — Emergency Department (HOSPITAL_COMMUNITY)
Admission: EM | Admit: 2021-05-05 | Discharge: 2021-05-05 | Disposition: A | Discharge status: Home / Self Care | Payer: Medicaid Other | Attending: Emergency Medicine | Admitting: Emergency Medicine

## 2021-05-05 ENCOUNTER — Encounter (HOSPITAL_COMMUNITY): Payer: Self-pay

## 2021-05-05 DIAGNOSIS — K625 Hemorrhage of anus and rectum: Secondary | ICD-10-CM | POA: Insufficient documentation

## 2021-05-05 DIAGNOSIS — K921 Melena: Secondary | ICD-10-CM

## 2021-05-05 DIAGNOSIS — R1031 Right lower quadrant pain: Secondary | ICD-10-CM

## 2021-05-05 LAB — COMPREHENSIVE METABOLIC PANEL
ALT: 33 U/L (ref 0–44)
AST: 20 U/L (ref 15–41)
Albumin: 4.3 g/dL (ref 3.5–5.0)
Alkaline Phosphatase: 57 U/L (ref 38–126)
Anion gap: 7 (ref 5–15)
BUN: 10 mg/dL (ref 6–20)
CO2: 27 mmol/L (ref 22–32)
Calcium: 9.2 mg/dL (ref 8.9–10.3)
Chloride: 103 mmol/L (ref 98–111)
Creatinine, Ser: 0.65 mg/dL (ref 0.61–1.24)
GFR, Estimated: >60 mL/min (ref >60)
Glucose, Bld: 131 mg/dL — ABNORMAL HIGH (ref 70–99)
Potassium: 3.2 mmol/L — ABNORMAL LOW (ref 3.5–5.1)
Sodium: 137 mmol/L (ref 135–145)
Total Bilirubin: 0.4 mg/dL (ref 0.3–1.2)
Total Protein: 7.8 g/dL (ref 6.5–8.1)

## 2021-05-05 LAB — URINALYSIS, ROUTINE W REFLEX MICROSCOPIC
Bilirubin Urine: NEGATIVE
Glucose, UA: NEGATIVE mg/dL
Hgb urine dipstick: NEGATIVE
Ketones, ur: NEGATIVE mg/dL
Leukocytes,Ua: NEGATIVE
Nitrite: NEGATIVE
Protein, ur: NEGATIVE mg/dL
Specific Gravity, Urine: 1.01 (ref 1.005–1.030)
pH: 8 (ref 5.0–8.0)

## 2021-05-05 LAB — CBC
HCT: 45.8 % (ref 39.0–52.0)
Hemoglobin: 15.9 g/dL (ref 13.0–17.0)
MCH: 29.5 pg (ref 26.0–34.0)
MCHC: 34.7 g/dL (ref 30.0–36.0)
MCV: 85 fL (ref 80.0–100.0)
Platelets: 181 10*3/uL (ref 150–400)
RBC: 5.39 MIL/uL (ref 4.22–5.81)
RDW: 12 % (ref 11.5–15.5)
WBC: 5.7 10*3/uL (ref 4.0–10.5)
nRBC: 0 % (ref 0.0–0.2)

## 2021-05-05 LAB — TYPE AND SCREEN
ABO/RH(D): O POS
Antibody Screen: NEGATIVE

## 2021-05-05 LAB — LIPASE, BLOOD: Lipase: 52 U/L — ABNORMAL HIGH (ref 11–51)

## 2021-05-05 LAB — POC OCCULT BLOOD, ED: Fecal Occult Bld: POSITIVE — AB

## 2021-05-05 MED ORDER — IOHEXOL 300 MG/ML  SOLN
100.0000 mL | Freq: Once | INTRAMUSCULAR | Status: AC | PRN
  Administered 2021-05-05: 100 mL via INTRAVENOUS

## 2021-05-05 MED ORDER — LACTATED RINGERS IV SOLN: INTRAVENOUS | Status: DC

## 2021-05-05 MED ORDER — PANTOPRAZOLE SODIUM 40 MG PO TBEC: 40.0000 mg | DELAYED_RELEASE_TABLET | Freq: Every day | ORAL | 0 refills | Status: DC

## 2021-05-05 MED ORDER — DICYCLOMINE HCL 20 MG PO TABS: 20.0000 mg | ORAL_TABLET | Freq: Three times a day (TID) | ORAL | 0 refills | Status: DC | PRN

## 2021-05-05 NOTE — ED Provider Notes (Signed)
Blood pressure 118/81, pulse 96, temperature 98.3 ?F (36.8 ?C), temperature source Oral, resp. rate (!) 24, SpO2 100 %. ? ?Assuming care from Dr. Freida Busman.  In short, Sean Ingram is a 31 y.o. male with a chief complaint of Blood In Stools and Abdominal Pain ?Marland Kitchen  Refer to the original H&P for additional details. ? ?The current plan of care is to CT abdomen/pelvis and reassess. ? ?03:30 PM  ?Patient's labs are reassuring. No anemia. CT abdomen/pelvis independently evaluated and agree with radiology interpretation. Discussed results with patient. No hemorrhoid symptoms. Plan for supportive care at home and establish care with PCP. Discussed that if blood continues he will need additional testing and possible referral to GI. Discussed ED return precautions.  ? ?  ?Maia Plan, MD ?05/05/21 1531 ? ?
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
## 2021-05-05 NOTE — Discharge Instructions (Addendum)
You were seen in the emergency department today with abdominal pain.  Your CT scan and lab work here are all reassuring.  I would like for you to establish care with a primary care doctor to follow your symptoms.  If they continue you may need referral to a gastroenterology doctor.  If you suddenly experience much worsening bleeding, pain, fever you should return to the emergency department. ?

## 2021-06-02 ENCOUNTER — Institutional Professional Consult (permissible substitution): Payer: Medicaid Other | Admitting: Pulmonary Disease

## 2021-06-02 ENCOUNTER — Ambulatory Visit: Payer: Medicaid Other | Attending: Physician Assistant | Admitting: Physician Assistant

## 2021-06-02 ENCOUNTER — Encounter: Payer: Self-pay | Admitting: Pulmonary Disease

## 2021-06-02 ENCOUNTER — Ambulatory Visit: Payer: Medicaid Other | Admitting: Pulmonary Disease

## 2021-06-02 ENCOUNTER — Ambulatory Visit (INDEPENDENT_AMBULATORY_CARE_PROVIDER_SITE_OTHER): Payer: Medicaid Other

## 2021-06-02 ENCOUNTER — Encounter: Payer: Self-pay | Admitting: Physician Assistant

## 2021-06-02 VITALS — BP 122/70 | HR 82 | Temp 98.6°F | Wt 185.0 lb

## 2021-06-02 VITALS — BP 116/75 | HR 86 | Resp 16 | Ht 66.0 in | Wt 183.0 lb

## 2021-06-02 DIAGNOSIS — R0609 Other forms of dyspnea: Secondary | ICD-10-CM

## 2021-06-02 DIAGNOSIS — J383 Other diseases of vocal cords: Secondary | ICD-10-CM | POA: Diagnosis not present

## 2021-06-02 DIAGNOSIS — Z09 Encounter for follow-up examination after completed treatment for conditions other than malignant neoplasm: Secondary | ICD-10-CM | POA: Diagnosis not present

## 2021-06-02 DIAGNOSIS — R0602 Shortness of breath: Secondary | ICD-10-CM

## 2021-06-02 DIAGNOSIS — K921 Melena: Secondary | ICD-10-CM

## 2021-06-02 MED ORDER — ALBUTEROL SULFATE HFA 108 (90 BASE) MCG/ACT IN AERS: 2.0000 | INHALATION_SPRAY | Freq: Four times a day (QID) | RESPIRATORY_TRACT | 6 refills | Status: DC | PRN
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
## 2021-06-02 NOTE — Patient Instructions (Signed)
Nice to meet you ? ?Your symptoms are a bit unusual for coming from the lung.  Description of your symptoms sounds like mostly coming from the throat or back part of the throat.  I worry about vocal cord dysfunction or swelling in the back of the throat.  I recommend seeing ENT doctor for this.  I will send a referral. ? ?For further evaluation we will get a chest x-ray today.  We will schedule pulmonary function test next available. ? ?In case something like asthma is contributing, use albuterol 2 puffs every 4 hours as needed.  If this is helpful please let me know and we will likely call in another inhaler in the future. ? ?Return to clinic in 3 months or sooner as needed with Dr. Silas Flood ?
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
## 2021-06-03 LAB — CBC WITH DIFFERENTIAL/PLATELET
Basophils Absolute: 0 10*3/uL (ref 0.0–0.2)
Basos: 0 %
EOS (ABSOLUTE): 0.2 10*3/uL (ref 0.0–0.4)
Eos: 4 %
Hematocrit: 44.9 % (ref 37.5–51.0)
Hemoglobin: 15.6 g/dL (ref 13.0–17.7)
Immature Grans (Abs): 0 10*3/uL (ref 0.0–0.1)
Immature Granulocytes: 0 %
Lymphocytes Absolute: 1.8 10*3/uL (ref 0.7–3.1)
Lymphs: 36 %
MCH: 30.1 pg (ref 26.6–33.0)
MCHC: 34.7 g/dL (ref 31.5–35.7)
MCV: 87 fL (ref 79–97)
Monocytes Absolute: 0.4 10*3/uL (ref 0.1–0.9)
Monocytes: 8 %
Neutrophils Absolute: 2.5 10*3/uL (ref 1.4–7.0)
Neutrophils: 52 %
Platelets: 155 10*3/uL (ref 150–450)
RBC: 5.19 x10E6/uL (ref 4.14–5.80)
RDW: 12.9 % (ref 11.6–15.4)
WBC: 4.8 10*3/uL (ref 3.4–10.8)

## 2021-06-03 LAB — COMPREHENSIVE METABOLIC PANEL
ALT: 59 IU/L — ABNORMAL HIGH (ref 0–44)
AST: 34 IU/L (ref 0–40)
Albumin/Globulin Ratio: 2.1 (ref 1.2–2.2)
Albumin: 4.8 g/dL (ref 4.0–5.0)
Alkaline Phosphatase: 67 IU/L (ref 44–121)
BUN/Creatinine Ratio: 16 (ref 9–20)
BUN: 14 mg/dL (ref 6–20)
Bilirubin Total: 0.5 mg/dL (ref 0.0–1.2)
CO2: 24 mmol/L (ref 20–29)
Calcium: 9.4 mg/dL (ref 8.7–10.2)
Chloride: 101 mmol/L (ref 96–106)
Creatinine, Ser: 0.85 mg/dL (ref 0.76–1.27)
Globulin, Total: 2.3 g/dL (ref 1.5–4.5)
Glucose: 113 mg/dL — ABNORMAL HIGH (ref 70–99)
Potassium: 4 mmol/L (ref 3.5–5.2)
Sodium: 138 mmol/L (ref 134–144)
Total Protein: 7.1 g/dL (ref 6.0–8.5)
eGFR: 119 mL/min/{1.73_m2} (ref >59)

## 2021-06-05 NOTE — Progress Notes (Signed)
Chest xray is clear

## 2021-06-09 ENCOUNTER — Ambulatory Visit (HOSPITAL_COMMUNITY)
Admission: EM | Admit: 2021-06-09 | Discharge: 2021-06-09 | Disposition: A | Discharge status: Home / Self Care | Payer: Medicaid Other | Attending: Physician Assistant | Admitting: Physician Assistant

## 2021-06-09 ENCOUNTER — Encounter (HOSPITAL_COMMUNITY): Payer: Self-pay

## 2021-06-09 ENCOUNTER — Inpatient Hospital Stay: Payer: Medicaid Other | Admitting: Physician Assistant

## 2021-06-09 DIAGNOSIS — R3915 Urgency of urination: Secondary | ICD-10-CM | POA: Diagnosis not present

## 2021-06-09 DIAGNOSIS — R110 Nausea: Secondary | ICD-10-CM | POA: Insufficient documentation

## 2021-06-09 DIAGNOSIS — R1012 Left upper quadrant pain: Secondary | ICD-10-CM

## 2021-06-09 DIAGNOSIS — R1013 Epigastric pain: Secondary | ICD-10-CM | POA: Diagnosis not present

## 2021-06-09 DIAGNOSIS — R1032 Left lower quadrant pain: Secondary | ICD-10-CM | POA: Diagnosis not present

## 2021-06-09 DIAGNOSIS — R350 Frequency of micturition: Secondary | ICD-10-CM | POA: Insufficient documentation

## 2021-06-09 LAB — COMPREHENSIVE METABOLIC PANEL
ALT: 88 U/L — ABNORMAL HIGH (ref 0–44)
AST: 41 U/L (ref 15–41)
Albumin: 4.3 g/dL (ref 3.5–5.0)
Alkaline Phosphatase: 57 U/L (ref 38–126)
Anion gap: 8 (ref 5–15)
BUN: 8 mg/dL (ref 6–20)
CO2: 25 mmol/L (ref 22–32)
Calcium: 9.6 mg/dL (ref 8.9–10.3)
Chloride: 105 mmol/L (ref 98–111)
Creatinine, Ser: 0.71 mg/dL (ref 0.61–1.24)
GFR, Estimated: >60 mL/min (ref >60)
Glucose, Bld: 106 mg/dL — ABNORMAL HIGH (ref 70–99)
Potassium: 3.5 mmol/L (ref 3.5–5.1)
Sodium: 138 mmol/L (ref 135–145)
Total Bilirubin: 0.9 mg/dL (ref 0.3–1.2)
Total Protein: 7.5 g/dL (ref 6.5–8.1)

## 2021-06-09 LAB — CBC WITH DIFFERENTIAL/PLATELET
Abs Immature Granulocytes: 0.01 10*3/uL (ref 0.00–0.07)
Basophils Absolute: 0 10*3/uL (ref 0.0–0.1)
Basophils Relative: 0 %
Eosinophils Absolute: 0.2 10*3/uL (ref 0.0–0.5)
Eosinophils Relative: 3 %
HCT: 44.2 % (ref 39.0–52.0)
Hemoglobin: 15.5 g/dL (ref 13.0–17.0)
Immature Granulocytes: 0 %
Lymphocytes Relative: 32 %
Lymphs Abs: 1.6 10*3/uL (ref 0.7–4.0)
MCH: 29.4 pg (ref 26.0–34.0)
MCHC: 35.1 g/dL (ref 30.0–36.0)
MCV: 83.9 fL (ref 80.0–100.0)
Monocytes Absolute: 0.4 10*3/uL (ref 0.1–1.0)
Monocytes Relative: 8 %
Neutro Abs: 2.8 10*3/uL (ref 1.7–7.7)
Neutrophils Relative %: 57 %
Platelets: 168 10*3/uL (ref 150–400)
RBC: 5.27 MIL/uL (ref 4.22–5.81)
RDW: 12.3 % (ref 11.5–15.5)
WBC: 5 10*3/uL (ref 4.0–10.5)
nRBC: 0 % (ref 0.0–0.2)

## 2021-06-09 LAB — POCT URINALYSIS DIPSTICK, ED / UC
Bilirubin Urine: NEGATIVE
Glucose, UA: NEGATIVE mg/dL
Ketones, ur: NEGATIVE mg/dL
Leukocytes,Ua: NEGATIVE
Nitrite: NEGATIVE
Protein, ur: NEGATIVE mg/dL
Specific Gravity, Urine: 1.025 (ref 1.005–1.030)
Urobilinogen, UA: 0.2 mg/dL (ref 0.0–1.0)
pH: 6.5 (ref 5.0–8.0)

## 2021-06-09 LAB — CBG MONITORING, ED: Glucose-Capillary: 105 mg/dL — ABNORMAL HIGH (ref 70–99)

## 2021-06-09 MED ORDER — PANTOPRAZOLE SODIUM 40 MG PO TBEC: 40.0000 mg | DELAYED_RELEASE_TABLET | Freq: Every day | ORAL | 0 refills | Status: DC

## 2021-06-09 MED ORDER — SUCRALFATE 1 G PO TABS: 1.0000 g | ORAL_TABLET | Freq: Three times a day (TID) | ORAL | 0 refills | Status: DC

## 2021-06-09 NOTE — ED Provider Notes (Signed)
?[facility] ? ? ? ?CSN: [id number] ?Arrival date & time: 06/09/21  1258 ? ? ?  ? ?History   ?Chief Complaint ?Chief Complaint  ?Patient presents with  ? Nausea  ? ? ?HPI ?Sean Ingram is a [age] y.o. male.  ? ?Patient presents today with a prolonged history of morning nausea without vomiting.  He denies any significant abdominal pain, esophageal discomfort.  He does have occasional blood in his stool and has been worked up by the emergency room for this on 05/05/2021 and reports that symptoms have since resolved.  He denies any diagnosis of GERD.  He is not taking any over-the-counter medication for symptom management.  He does report urinary frequency and urgency to the point that he has difficulty controlling his urine.  He reports a family history of diabetes in his mother and is interested in being tested for this today.  Denies any polyphagia or significant polydipsia.  Denies any recent medication changes. ? ? ?History reviewed. No pertinent past medical history. ? ?Patient Active Problem List  ? Diagnosis Date Noted  ? Appendicitis, acute 09/13/2012  ? ? ?Past Surgical History:  ?Procedure Laterality Date  ? APPENDECTOMY  09/12/2012  ? LAPAROSCOPIC APPENDECTOMY N/A 09/13/2012  ? Procedure: APPENDECTOMY LAPAROSCOPIC;  Surgeon: Liz MaladyBurke E Thompson, MD;  [facility];  Service: General;  Laterality: N/A;  ? ? ? ? ? ?Home Medications   ? ?Prior to Admission medications   ?Medication Sig Start Date End Date Taking? Authorizing Provider  ?sucralfate (CARAFATE) 1 g tablet Take 1 tablet (1 g total) by mouth 4 (four) times daily -  with meals and at bedtime. 06/09/21  Yes Kyrene Longan K, PA-C  ?albuterol (VENTOLIN HFA) 108 (90 Base) MCG/ACT inhaler Inhale 2 puffs into the lungs every 6 (six) hours as needed for wheezing or shortness of breath. 06/02/21   Hunsucker, Lesia SagoMatthew R, MD  ?clotrimazole (LOTRIMIN) 1 % cream Apply to affected area 2 times daily 04/25/17   Cardama, Amadeo GarnetPedro Eduardo, MD  ?dicyclomine (BENTYL)  20 MG tablet Take 1 tablet (20 mg total) by mouth 3 (three) times daily as needed for spasms. 05/05/21   Long, Arlyss RepressJoshua G, MD  ?hydrocortisone (ANUSOL-HC) 25 MG suppository Place 1 suppository (25 mg total) rectally 2 (two) times daily. For 7 days 03/20/20   Dartha LodgeFord, Kelsey N, PA-C  ?lidocaine (XYLOCAINE) 2 % solution Use as directed 15 mLs in the mouth or throat as needed for mouth pain. 05/27/17   Khatri, Hina, PA-C  ?naproxen (NAPROSYN) 500 MG tablet Take 1 tablet (500 mg total) by mouth 2 (two) times daily. 05/27/17   Dietrich PatesKhatri, Hina, PA-C  ?ondansetron (ZOFRAN ODT) 4 MG disintegrating tablet 4mg  ODT q4 hours prn nausea/vomit 03/20/20   Dartha LodgeFord, Kelsey N, PA-C  ?pantoprazole (PROTONIX) 40 MG tablet Take 1 tablet (40 mg total) by mouth daily. 06/09/21   Lark Runk, Noberto RetortErin K, PA-C  ?polyethylene glycol (MIRALAX) packet Take 17 g by mouth daily. 04/25/17   Nira Connardama, Pedro Eduardo, MD  ? ? ?Family History ?Family History  ?Problem Relation Age of Onset  ? Diabetes Mother   ? ? ?Social History ?Social History  ? ?Tobacco Use  ? Smoking status: Former  ?  Packs/day: 0.50  ?  Types: Cigarettes  ?  Quit date: 02/25/2017  ?  Years since quitting: 4.2  ? Smokeless tobacco: Never  ? Tobacco comments:  ?  Pt states he quit smoking in 2019   ?Vaping Use  ? Vaping Use: Never used  ?  Substance Use Topics  ? Alcohol use: Yes  ?  Comment: occasionally  ? Drug use: No  ? ? ? ?Allergies   ?Patient has no known allergies. ? ? ?Review of Systems ?Review of Systems  ?Constitutional:  Positive for activity change. Negative for appetite change, fatigue and fever.  ?Gastrointestinal:  Positive for abdominal pain and nausea. Negative for blood in stool (Improved), diarrhea and vomiting.  ?Endocrine: Positive for polyuria. Negative for polydipsia and polyphagia.  ?Genitourinary:  Positive for frequency and urgency.  ? ? ?Physical Exam ?Triage Vital Signs ?ED Triage Vitals  ?Enc Vitals Group  ?   BP 06/09/21 1402 128/86  ?   Pulse Rate 06/09/21 1402 80  ?   Resp  06/09/21 1402 18  ?   Temp 06/09/21 1402 98.2 ?F (36.8 ?C)  ?   Temp Source 06/09/21 1402 Oral  ?   SpO2 06/09/21 1402 97 %  ?   Weight --   ?   Height --   ?   Head Circumference --   ?   Peak Flow --   ?   Pain Score 06/09/21 1403 0  ?   Pain Loc --   ?   Pain Edu? --   ?   Excl. in GC? --   ? ?No data found. ? ?Updated Vital Signs ?BP 128/86 (BP Location: Right Arm)   Pulse 80   Temp 98.2 ?F (36.8 ?C) (Oral)   Resp 18   SpO2 97%  ? ?Visual Acuity ?Right Eye Distance:   ?Left Eye Distance:   ?Bilateral Distance:   ? ?Right Eye Near:   ?Left Eye Near:    ?Bilateral Near:    ? ?Physical Exam ?Vitals reviewed.  ?Constitutional:   ?   General: He is awake.  ?   Appearance: Normal appearance. He is well-developed. He is not ill-appearing.  ?   Comments: Very pleasant male appears stated age in no acute distress sitting comfortably on exam table  ?HENT:  ?   Head: Normocephalic and atraumatic.  ?   Mouth/Throat:  ?   Pharynx: No oropharyngeal exudate, posterior oropharyngeal erythema or uvula swelling.  ?Cardiovascular:  ?   Rate and Rhythm: Normal rate and regular rhythm.  ?   Heart sounds: Normal heart sounds, S1 normal and S2 normal. No murmur heard. ?Pulmonary:  ?   Effort: Pulmonary effort is normal.  ?   Breath sounds: Normal breath sounds. No stridor. No wheezing, rhonchi or rales.  ?   Comments: Clear to auscultation bilaterally ?Abdominal:  ?   General: Bowel sounds are normal.  ?   Palpations: Abdomen is soft.  ?   Tenderness: There is abdominal tenderness in the epigastric area, left upper quadrant and left lower quadrant. There is no right CVA tenderness, left CVA tenderness, guarding or rebound.  ?   Comments: Tenderness palpation throughout upper abdomen and left abdomen.  No evidence of acute abdomen on physical exam.  ?Neurological:  ?   Mental Status: He is alert.  ?Psychiatric:     ?   Behavior: Behavior is cooperative.  ? ? ? ?UC Treatments / Results  ?Labs ?(all labs ordered are listed, but only  abnormal results are displayed) ?Labs Reviewed  ?CBG MONITORING, ED - Abnormal; Notable for the following components:  ?    Result Value  ? Glucose-Capillary 105 (*)   ? All other components within normal limits  ?POCT URINALYSIS DIPSTICK, ED / UC - Abnormal; Notable for  the following components:  ? Hgb urine dipstick TRACE (*)   ? All other components within normal limits  ?URINE CULTURE  ?CBC WITH DIFFERENTIAL/PLATELET  ?COMPREHENSIVE METABOLIC PANEL  ? ? ?EKG ? ? ?Radiology ?No results found. ? ?Procedures ?Procedures (including critical care time) ? ?Medications Ordered in UC ?Medications - No data to display ? ?Initial Impression / Assessment and Plan / UC Course  ?I have reviewed the triage vital signs and the nursing notes. ? ?Pertinent labs & imaging results that were available during my care of the patient were reviewed by me and considered in my medical decision making (see chart for details). ? ?  ? ?Patient is well-appearing, afebrile, nontoxic, nontachycardic.  Blood glucose was appropriate.  UA showed trace hemoglobin without evidence of infection.  Given trace hemoglobin with urinary symptoms we will send this off for culture but defer antibiotics until results are available.  Suspect symptoms are related to uncontrolled GERD.  Patient was provided a refill of Protonix as previously prescribed as well as will start Carafate.  He was instructed to use dietary and lifestyle modification for additional symptom relief.  If symptoms persist he is to follow-up with GI specialist and was given contact information for local provider with instruction to call to schedule an appointment.  He does not currently have a primary care so we will try to establish him with someone via PCP assistance.  CBC and CMP were obtained today-results pending.  We will contact patient if there are any significant abnormalities that change treatment plan.  Discussed alarm symptoms that warrant emergent evaluation.  Strict return  precautions given to which patient expressed understanding. ? ?Final Clinical Impressions(s) / UC Diagnoses  ? ?Final diagnoses:  ?Nausea without vomiting  ?Urinary frequency  ?Urinary urgency  ? ? ? ?Discharge

## 2021-06-09 NOTE — Discharge Instructions (Addendum)
Your blood sugar was appropriate.  Your urine was normal with the exception of a little bit of blood.  I am sending her urine off for culture and we will contact you if we need to start any antibiotics.  I do recommend that you follow-up with primary care within a few weeks to have this repeated.  We will contact you with your lab work if anything is abnormal.  I believe that your symptoms are most likely related to upset stomach.  Continue Protonix as previously prescribed; I have put in a refill for that.  Start Carafate before each meal before bed to help coat your stomach.  Avoid spicy/acidic/fatty foods.  Make sure you are drinking plenty of fluid.  Avoid alcohol and NSAIDs (aspirin, ibuprofen/Advil, naproxen/Aleve).  I have given you the contact information for a stomach specialist and if your symptoms do not improve please follow-up with them; call to schedule appointment.  If anything worsens suddenly and you have nausea/vomiting interfering with your oral intake, recurrent blood in your stool, fever, weakness, chest pain, shortness of breath you need to go to the emergency room. ?

## 2021-06-09 NOTE — ED Triage Notes (Signed)
Pt states for the past month he feels nauseous in the morning when he wakes up. Denies vomiting. States he wants to be tested for diabetes and HTN ?

## 2021-06-10 ENCOUNTER — Telehealth (HOSPITAL_COMMUNITY): Payer: Self-pay | Admitting: Emergency Medicine

## 2021-06-10 LAB — URINE CULTURE: Culture: NO GROWTH

## 2021-06-10 MED ORDER — SUCRALFATE 1 G PO TABS: 1.0000 g | ORAL_TABLET | Freq: Three times a day (TID) | ORAL | 0 refills | Status: DC

## 2021-06-10 MED ORDER — PANTOPRAZOLE SODIUM 40 MG PO TBEC: 40.0000 mg | DELAYED_RELEASE_TABLET | Freq: Every day | ORAL | 0 refills | Status: DC

## 2021-06-10 NOTE — Telephone Encounter (Signed)
Patient needed prescription sent to a different pharmacy 

## 2021-06-11 ENCOUNTER — Encounter: Payer: Self-pay | Admitting: Internal Medicine

## 2021-06-11 ENCOUNTER — Institutional Professional Consult (permissible substitution): Payer: Medicaid Other | Admitting: Pulmonary Disease

## 2021-06-18 ENCOUNTER — Encounter: Payer: Self-pay | Admitting: *Deleted

## 2021-06-30 ENCOUNTER — Encounter: Payer: Self-pay | Admitting: Internal Medicine

## 2021-06-30 ENCOUNTER — Other Ambulatory Visit (INDEPENDENT_AMBULATORY_CARE_PROVIDER_SITE_OTHER): Payer: Medicaid Other

## 2021-06-30 ENCOUNTER — Ambulatory Visit (INDEPENDENT_AMBULATORY_CARE_PROVIDER_SITE_OTHER): Payer: Medicaid Other | Admitting: Internal Medicine

## 2021-06-30 VITALS — BP 140/70 | HR 84 | Ht 66.0 in | Wt 190.0 lb

## 2021-06-30 DIAGNOSIS — R140 Abdominal distension (gaseous): Secondary | ICD-10-CM

## 2021-06-30 DIAGNOSIS — R1084 Generalized abdominal pain: Secondary | ICD-10-CM | POA: Diagnosis not present

## 2021-06-30 DIAGNOSIS — R7989 Other specified abnormal findings of blood chemistry: Secondary | ICD-10-CM

## 2021-06-30 DIAGNOSIS — K219 Gastro-esophageal reflux disease without esophagitis: Secondary | ICD-10-CM

## 2021-06-30 DIAGNOSIS — K625 Hemorrhage of anus and rectum: Secondary | ICD-10-CM | POA: Diagnosis not present

## 2021-06-30 LAB — IBC + FERRITIN
Ferritin: 53.5 ng/mL (ref 22.0–322.0)
Iron: 67 ug/dL (ref 42–165)
Saturation Ratios: 18.7 % — ABNORMAL LOW (ref 20.0–50.0)
TIBC: 358.4 ug/dL (ref 250.0–450.0)
Transferrin: 256 mg/dL (ref 212.0–360.0)

## 2021-06-30 LAB — PROTIME-INR
INR: 1.3 ratio — ABNORMAL HIGH (ref 0.8–1.0)
Prothrombin Time: 13.7 s — ABNORMAL HIGH (ref 9.6–13.1)

## 2021-06-30 NOTE — Progress Notes (Signed)
Chief Complaint: Rectal bleeding  HPI : 31 year old male with history of obesity presents with rectal bleeding, constipation, and nausea  He has been seeing bleeding in his stool intermittently since 04/2021. The blood is in the toilet and is a red color. This has happened several times. Endorses some rectal pain when he is having a BM. He is having abdominal pain all over his abdomen. Denies diarrhea or constipation. He is passing flatus a lot. Also feels bloated. He is feeling nauseous in the morning. Denies vomiting. Denies alcohol use. Sometimes he will have headaches. Denies fam hx of GI issues. Denies prior EGD or colonoscopy. He has been prescribed pantoprazole 40 mg QD and sucralfate 1 g QID, which has helped somewhat. He is not taking anything to help himself poop. He is using naproxen medication. Endorses chest burning. Denies regurgitation. Denies dysphagia.   Patient is from [place] and works as a Geophysicist/field seismologist.   History reviewed. No pertinent past medical history.   Past Surgical History:  Procedure Laterality Date   APPENDECTOMY  09/12/2012   LAPAROSCOPIC APPENDECTOMY N/A 09/13/2012   Procedure: APPENDECTOMY LAPAROSCOPIC;  Surgeon: Zenovia Jarred, MD;  Location: [facility];  Service: General;  Laterality: N/A;   Family History  Problem Relation Age of Onset   Diabetes Mother    Stomach cancer Neg Hx    Colon cancer Neg Hx    Social History   Tobacco Use   Smoking status: Former    Packs/day: 0.50    Types: Cigarettes    Quit date: 02/25/2017    Years since quitting: 4.3   Smokeless tobacco: Never   Tobacco comments:    Pt states he quit smoking in 2019   Vaping Use   Vaping Use: Never used  Substance Use Topics   Alcohol use: Never   Drug use: No   Current Outpatient Medications  Medication Sig Dispense Refill   clotrimazole (LOTRIMIN) 1 % cream Apply to affected area 2 times daily 15 g 0   naproxen (NAPROSYN) 500 MG tablet Take 1 tablet (500 mg total) by mouth 2  (two) times daily. 30 tablet 0   ondansetron (ZOFRAN ODT) 4 MG disintegrating tablet 4mg  ODT q4 hours prn nausea/vomit 10 tablet 0   pantoprazole (PROTONIX) 40 MG tablet Take 1 tablet (40 mg total) by mouth daily. 30 tablet 0   polyethylene glycol (MIRALAX) packet Take 17 g by mouth daily. 14 each 0   albuterol (VENTOLIN HFA) 108 (90 Base) MCG/ACT inhaler Inhale 2 puffs into the lungs every 6 (six) hours as needed for wheezing or shortness of breath. (Patient not taking: Reported on 06/30/2021) 8 g 6   dicyclomine (BENTYL) 20 MG tablet Take 1 tablet (20 mg total) by mouth 3 (three) times daily as needed for spasms. (Patient not taking: Reported on 06/30/2021) 20 tablet 0   No current facility-administered medications for this visit.   No Known Allergies   Review of Systems: All systems reviewed and negative except where noted in HPI.   Physical Exam: BP 140/70   Pulse 84   Ht 5\' 6"  (1.676 m)   Wt 190 lb (86.2 kg)   BMI 30.67 kg/m  Constitutional: Pleasant,well-developed, male in no acute distress. HEENT: Normocephalic and atraumatic. Conjunctivae are normal. No scleral icterus. Cardiovascular: Normal rate, regular rhythm.  Pulmonary/chest: Effort normal and breath sounds normal. No wheezing, rales or rhonchi. Abdominal: Soft, nondistended, mild diffuse tenderness to palpation. Bowel sounds active throughout. There are no masses  palpable. No hepatomegaly. Extremities: No edema Neurological: Alert and oriented to person place and time. Skin: Skin is warm and dry. No rashes noted. Psychiatric: Normal mood and affect. Behavior is normal.  Labs 04/2021: Lipase mildly elevated at 52.   Labs 05/2021: CBC nml. CMP with mildly elevated ALT of 59.  CT A/P w/contrast 03/20/20: IMPRESSION: No acute intra-abdominal pathology identified. No radiographic explanation for the patient's reported symptoms.  CT A/P w/contrast 05/05/21: IMPRESSION: No acute abnormality. No evidence of bowel  obstruction or acute bowel inflammation.  ASSESSMENT AND PLAN: Rectal bleeding Generalized abdominal pain Bloating Elevated LFTs GERD Patient presents with rectal bleeding along with generalized abdominal pain and bloating over the last 2 months.  Difficult to ascertain what is causing his symptoms, but could consider constipation leading to hemorrhoidal bleeding as one possible explanation. Patient has also been noted to have elevated LFTs in the past, as far back as 2017 from review of his laboratory values. This may be due to fatty liver, but will perform some basic work up to rule out other etiologies of liver disease. Could consider contribution from gallstones. Will plan for further work up with abdominal ultrasound to evaluate for gallstones, as well as EGD and colonoscopy to look for sources of abdominal pain and rectal bleeding. - Low FODMAP diet - Check INR, ANA, ASMA, AMA, IgG, ferritin, iron/TIBC, viral hepatitis panel, A1AT, ceruloplasmin - Abdominal U/S - EGD/colonoscopy [facility]  Christia Reading, MD  I spent 65 minutes of time, including in depth chart review, independent review of results as outlined above, communicating results with the patient directly, face-to-face time with the patient, coordinating care, ordering studies and medications as appropriate, and documentation.

## 2021-06-30 NOTE — Patient Instructions (Signed)
If you are age 32 or older, your body mass index should be between 23-30. Your Body mass index is 30.67 kg/m?Marland Kitchen If this is out of the aforementioned range listed, please consider follow up with your Primary Care Provider. ? ?If you are age 51 or younger, your body mass index should be between 19-25. Your Body mass index is 30.67 kg/m?Marland Kitchen If this is out of the aformentioned range listed, please consider follow up with your Primary Care Provider.  ? ?Your provider has requested that you go to the basement level for lab work before leaving today. Press "B" on the elevator. The lab is located at the first door on the left as you exit the elevator. ? ?Please follow [place] map provided.  ? ?You have been scheduled for an abdominal ultrasound at [facility] (1st floor of hospital) on 07/05/21 at 10am. Please arrive 15 minutes prior to your appointment for registration. Make certain not to have anything to eat or drink 6 hours prior to your appointment. Should you need to reschedule your appointment, please contact radiology at [phone]. This test typically takes about 30 minutes to perform. ? ?Due to recent changes in healthcare laws, you may see the results of your imaging and laboratory studies on MyChart before your provider has had a chance to review them.  We understand that in some cases there may be results that are confusing or concerning to you. Not all laboratory results come back in the same time frame and the provider may be waiting for multiple results in order to interpret others.  Please give [place] 48 hours in order for your provider to thoroughly review all the results before contacting the office for clarification of your results.  ? ? ? ?[facility] providers would like to encourage you to use [facility] to communicate with providers for non-urgent requests or questions.  Due to long hold times on the telephone, sending your provider a message by [facility] may be a faster and more efficient way to  get a response.  Please allow 48 business hours for a response.  Please remember that this is for non-urgent requests.  ? ?Thank you for entrusting me with your care and for choosing [facility], ?Dr. Eulah Pont ? ? ?

## 2021-07-05 ENCOUNTER — Ambulatory Visit (HOSPITAL_COMMUNITY)
Admission: RE | Admit: 2021-07-05 | Discharge: 2021-07-05 | Disposition: A | Discharge status: Home / Self Care | Payer: Medicaid Other | Source: Ambulatory Visit | Attending: Internal Medicine | Admitting: Internal Medicine

## 2021-07-05 DIAGNOSIS — R140 Abdominal distension (gaseous): Secondary | ICD-10-CM | POA: Diagnosis present

## 2021-07-05 DIAGNOSIS — R7989 Other specified abnormal findings of blood chemistry: Secondary | ICD-10-CM

## 2021-07-05 DIAGNOSIS — K219 Gastro-esophageal reflux disease without esophagitis: Secondary | ICD-10-CM

## 2021-07-05 DIAGNOSIS — R1084 Generalized abdominal pain: Secondary | ICD-10-CM | POA: Diagnosis present

## 2021-07-05 DIAGNOSIS — K625 Hemorrhage of anus and rectum: Secondary | ICD-10-CM | POA: Diagnosis present

## 2021-07-06 LAB — ALPHA-1-ANTITRYPSIN: A-1 Antitrypsin, Ser: 113 mg/dL (ref 83–199)

## 2021-07-06 LAB — CERULOPLASMIN: Ceruloplasmin: 21 mg/dL (ref 18–36)

## 2021-07-06 LAB — HEPATITIS A ANTIBODY, TOTAL: Hepatitis A AB,Total: REACTIVE — AB

## 2021-07-06 LAB — IGG: IgG (Immunoglobin G), Serum: 1183 mg/dL (ref 600–1640)

## 2021-07-06 LAB — ANTI-SMOOTH MUSCLE ANTIBODY, IGG: Actin (Smooth Muscle) Antibody (IGG): <20 U (ref <20)

## 2021-07-06 LAB — HEPATITIS C ANTIBODY
Hepatitis C Ab: NONREACTIVE
SIGNAL TO CUT-OFF: 0.16 (ref <1.00)

## 2021-07-06 LAB — HEPATITIS B SURFACE ANTIBODY,QUALITATIVE: Hep B S Ab: NONREACTIVE

## 2021-07-06 LAB — ANA: Anti Nuclear Antibody (ANA): NEGATIVE

## 2021-07-06 LAB — HEPATITIS B SURFACE ANTIGEN: Hepatitis B Surface Ag: NONREACTIVE

## 2021-07-06 LAB — MITOCHONDRIAL ANTIBODIES: Mitochondrial M2 Ab, IgG: <=20 U (ref <=20.0)

## 2021-07-26 ENCOUNTER — Telehealth: Payer: Self-pay | Admitting: Internal Medicine

## 2021-07-26 ENCOUNTER — Encounter: Payer: Self-pay | Admitting: Internal Medicine

## 2021-07-26 NOTE — Telephone Encounter (Signed)
Good morning Dr. Leonides Schanz,   Patient called stating that he needed to cancel his endo and colon procedures on 6/16 due to not having transportation.   Patient stated he would call back at a later time to reschedule.

## 2021-07-30 ENCOUNTER — Encounter: Payer: Medicaid Other | Admitting: Internal Medicine
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
## 2021-09-06 ENCOUNTER — Ambulatory Visit: Payer: Medicaid Other | Admitting: Critical Care Medicine

## 2021-09-08 ENCOUNTER — Ambulatory Visit: Payer: Medicaid Other | Admitting: Pulmonary Disease

## 2021-09-23 HISTORY — PX: KNEE ARTHROSCOPY W/ PARTIAL MEDIAL MENISCECTOMY: SHX1882

## 2021-10-13 ENCOUNTER — Other Ambulatory Visit: Payer: Self-pay

## 2021-10-13 ENCOUNTER — Ambulatory Visit: Payer: Medicaid Other | Attending: Orthopedic Surgery

## 2021-10-13 DIAGNOSIS — R262 Difficulty in walking, not elsewhere classified: Secondary | ICD-10-CM | POA: Insufficient documentation

## 2021-10-13 DIAGNOSIS — R600 Localized edema: Secondary | ICD-10-CM | POA: Diagnosis present

## 2021-10-13 DIAGNOSIS — G8929 Other chronic pain: Secondary | ICD-10-CM | POA: Diagnosis present

## 2021-10-13 DIAGNOSIS — M25562 Pain in left knee: Secondary | ICD-10-CM | POA: Insufficient documentation

## 2021-10-13 DIAGNOSIS — M6281 Muscle weakness (generalized): Secondary | ICD-10-CM | POA: Insufficient documentation

## 2021-10-13 NOTE — Therapy (Signed)
OUTPATIENT PHYSICAL THERAPY LOWER EXTREMITY EVALUATION   Patient Name: Sean Ingram MRN: [id number] DOB:Sep 27, 1990, 31 y.o., male Today's Date: 10/13/2021   PT End of Session - 10/13/21 1329     Visit Number 1    Number of Visits 9    Date for PT Re-Evaluation 12/15/21    Authorization Type [employer]    Authorization Time Period Pending auth    PT Start Time 1310   Pt arrived 10 minutes late to appointment.   PT Stop Time 1345    PT Time Calculation (min) 35 min    Activity Tolerance Patient tolerated treatment well    Behavior During Therapy [facility] for tasks assessed/performed             History reviewed. No pertinent past medical history. Past Surgical History:  Procedure Laterality Date   APPENDECTOMY  09/12/2012   KNEE ARTHROSCOPY W/ PARTIAL MEDIAL MENISCECTOMY Left 09/23/2021   LAPAROSCOPIC APPENDECTOMY N/A 09/13/2012   Procedure: APPENDECTOMY LAPAROSCOPIC;  Surgeon: Liz Malady, MD;  Location: [facility];  Service: General;  Laterality: N/A;   Patient Active Problem List   Diagnosis Date Noted   Appendicitis, acute 09/13/2012    PCP: No PCP  REFERRING PROVIDER: Jenne Pane, PA-C  REFERRING DIAG: S/P 09/23/2021 partial medial meniscectomy and plica excision  THERAPY DIAG:  Chronic pain of left knee  Difficulty in walking, not elsewhere classified  Muscle weakness (generalized)  Localized edema  Rationale for Evaluation and Treatment Rehabilitation  ONSET DATE: 09/23/2021  SUBJECTIVE:   SUBJECTIVE STATEMENT: Pt reports 20 days s/p Lt knee partial medial meniscectomy and plica excision on 09/23/2021 with Dr. Norval Morton. Pt was instructed to sleep on his Rt side and to utilize axillary crutches initially, although he is now walking without AD. 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 Pt endorses post-op swelling, although this has improved in the past couple of weeks. Pt denies any N/T  related to this issue. Aggravating factors include sitting > 1 hour, descending stairs. Easing factors include pain medication, ice. Current pain is 0/10. Worst pain is 5/10.   PERTINENT HISTORY: S/P 09/23/2021 partial medial meniscectomy and plica excision  PAIN:  Are you having pain? Yes: NPRS scale: 0/10 Pain location: Lt knee Pain description: achy Aggravating factors: sitting > 1 hour, descending stairs Relieving factors: pain medication, ice  PRECAUTIONS: Knee, in regard to post-op status, utilizing [facility] partial meniscectomy post-op protocol  WEIGHT BEARING RESTRICTIONS No  FALLS:  Has patient fallen in last 6 months? No  LIVING ENVIRONMENT: Lives with: lives with their family Lives in: House/apartment Stairs: Yes: Internal: 15 steps; on left going up Has following equipment at home: Crutches  OCCUPATION: Art therapist  PLOF: Independent  PATIENT GOALS Return to working with less limitation, household ADLs, yardwork   OBJECTIVE:   DIAGNOSTIC FINDINGS: None available  PATIENT SURVEYS:  LEFS 50/80  COGNITION:  Overall cognitive status: Within functional limits for tasks assessed     SENSATION: Not tested  EDEMA:  Circumferential: At inferior patellar pole: 41cm on Lt, 40cm on Rt  MUSCLE LENGTH: Hamstring 90/90 test: Right 30 deg shy of full extension; Left 60 deg shy of full extension Ely's test: (-) on Rt, not tested on Lt   PALPATION: TTP to medial joint line on Lt  PASSIVE ACCESSORY MOBILITY: Mild pain with Lt lateral patellar glide  LOWER EXTREMITY ROM:  A/PROM Right eval  Left eval  Knee flexion 130/135 124/126 mild p!  Knee extension 0/2 -4/-2   (Blank rows = not tested)  LOWER EXTREMITY MMT:  MMT Right eval Left eval  Hip flexion 4+/5 4/5  Hip extension 4+/5 4/5  Hip abduction 4+/5 4/5  Knee flexion 5/5 4/5  Knee extension 5/5 4+/5   (Blank rows = not tested)   FUNCTIONAL TESTS:  Squat: 50%  limited, posterior Lt knee p! 5xSTS: 18 seconds  GAIT: Distance walked: 20 feet Assistive device utilized: None Level of assistance: Complete Independence Comments: Mild Lt antalgic gait with decreased Lt step length    TODAY'S TREATMENT: 10/13/2021: Demonstrated and issued HEP   PATIENT EDUCATION:  Education details: Pt educated on underlying pathophysiology, POC, prognosis, LEFS, and HEP Person educated: Patient Education method: Explanation, Demonstration, and Handouts Education comprehension: verbalized understanding and returned demonstration   HOME EXERCISE PROGRAM: Access Code: [email or web address] URL: [email or web address] Date: 10/13/2021 Prepared by: Carmelina Dane  Exercises - Lateral Step Up (Mirrored)  - 1 x daily - 7 x weekly - 3 sets - 15 reps - Mini Squat with Counter Support  - 1 x daily - 7 x weekly - 3 sets - 10 reps - Seated Hamstring Stretch  - 1 x daily - 7 x weekly - 2 sets - 1 minute hold - Side Plank on Knees  - 1 x daily - 7 x weekly - 3 sets - 10 reps - 3 seconds hold  ASSESSMENT:  CLINICAL IMPRESSION: Patient is a 31 y.o. M who was seen today for physical therapy evaluation and treatment for Lt knee pain, stiffness, and swelling 20 days s/p Lt partial medial meniscectomy and plica excision on 09/23/2021.  Upon assessment, his primary impairments include limited Lt knee flexion and extension ROM, TTP to Lt medial joint line, mild swelling at Lt knee, pain with lateral Lt patellar glides, limited and painful squat, limited 5xSTS, weak Lt knee MMT, and weak BIL Lt>Rt hip MMT. Pt will benefit from skilled PT to address his primary impairments and return to his prior level of function with less limitation.   OBJECTIVE IMPAIRMENTS Abnormal gait, decreased balance, decreased endurance, decreased mobility, difficulty walking, decreased ROM, decreased strength, hypomobility, increased edema, impaired flexibility, improper body mechanics, postural  dysfunction, and pain.   ACTIVITY LIMITATIONS bending, sitting, stairs, transfers, toileting, dressing, and locomotion level  PARTICIPATION LIMITATIONS: cleaning, laundry, driving, shopping, community activity, occupation, and yard work  PERSONAL FACTORS  N/A  are also affecting patient's functional outcome.   REHAB POTENTIAL: Excellent  CLINICAL DECISION MAKING: Stable/uncomplicated  EVALUATION COMPLEXITY: Low   GOALS: Goals reviewed with patient? Yes  SHORT TERM GOALS: Target date: 11/10/2021  Pt will report understanding and adherence to initial HEP in order to promote independence in the management of primary impairments. Baseline: Pt provided HEP at eval Goal status: INITIAL    LONG TERM GOALS: Target date: 12/08/2021   Pt will achieve an LEFS score of 65/80 in order to demonstrate improved functional ability as it relates to his knee impairments. Baseline: 50/80 Goal status: INITIAL  2.  Pt will achieve Lt knee AROM from 0-130 in order to achieve a normalized gait pattern for community ambulation. Baseline: -4-124 Goal status: INITIAL  3.  Pt will report ability to sit >2 hours with 0/10 pain in order to complete his job duties with less limitation. Baseline: >2/10 pain with 1 hour of sitting Goal status: INITIAL  4.  Pt will achieve BIL global LE strength of 5/5 in  order to progress his independent LE strengthening regimen with less limitation.  Baseline: See MMT chart Goal status: INITIAL  5.  Pt will demonstrate 5 WNL depth squats with 0/10 pain in order to pick up groceries from the floor with less limitation. Baseline: 50% limited squat with 2/10 pain Goal status: INITIAL    PLAN: PT FREQUENCY: 1x/week  PT DURATION: 8 weeks  PLANNED INTERVENTIONS: Therapeutic exercises, Therapeutic activity, Neuromuscular re-education, Balance training, Gait training, Patient/Family education, Self Care, Joint mobilization, Stair training, Aquatic Therapy, Dry Needling,  Electrical stimulation, Cryotherapy, Compression bandaging, Taping, Vasopneumatic device, Biofeedback, Ionotophoresis 4mg /ml Dexamethasone, Manual therapy, and Re-evaluation  PLAN FOR NEXT SESSION: Progress Lt knee strengthening/ ROM in accordance with [facility] partial meniscectomy post-op protocol    Check all possible CPT codes: [facility] - PT Re-evaluation, 97110- Therapeutic Exercise, [phone]- Neuro Re-education, [phone] - Gait Training, 97140 - Manual Therapy, 97530 - Therapeutic Activities, 97535 - Self Care, 97014 - Electrical stimulation (unattended), [phone] - Electrical stimulation (Manual), 09233 - Vaso, and [id number] - Aquatic therapy     If treatment provided at initial evaluation, no treatment charged due to lack of authorization.       [id number], PT, DPT 10/13/21 2:25 PM

## 2021-10-20 ENCOUNTER — Ambulatory Visit: Payer: Medicaid Other | Attending: Orthopedic Surgery

## 2021-10-20 DIAGNOSIS — R262 Difficulty in walking, not elsewhere classified: Secondary | ICD-10-CM | POA: Insufficient documentation

## 2021-10-20 DIAGNOSIS — M25562 Pain in left knee: Secondary | ICD-10-CM | POA: Insufficient documentation

## 2021-10-20 DIAGNOSIS — R600 Localized edema: Secondary | ICD-10-CM | POA: Diagnosis present

## 2021-10-20 DIAGNOSIS — M6281 Muscle weakness (generalized): Secondary | ICD-10-CM | POA: Insufficient documentation

## 2021-10-20 DIAGNOSIS — G8929 Other chronic pain: Secondary | ICD-10-CM | POA: Diagnosis present

## 2021-10-20 NOTE — Therapy (Signed)
OUTPATIENT PHYSICAL THERAPY TREATMENT NOTE   Patient Name: Sean Ingram MRN: [id number] DOB:12-30-1990, 31 y.o., male Today's Date: 10/20/2021  PCP: No PCP REFERRING PROVIDER: Jenne Pane, PA-C  END OF SESSION:   PT End of Session - 10/20/21 1215     Visit Number 2    Number of Visits 9    Date for PT Re-Evaluation 12/15/21    Authorization Type [employer]    Authorization Time Period 10/14/2021-01/11/2022    Authorization - Visit Number 1    Authorization - Number of Visits 10    PT Start Time 1215    PT Stop Time 1255    PT Time Calculation (min) 40 min    Activity Tolerance Patient tolerated treatment well    Behavior During Therapy [facility] for tasks assessed/performed             History reviewed. No pertinent past medical history. Past Surgical History:  Procedure Laterality Date   APPENDECTOMY  09/12/2012   KNEE ARTHROSCOPY W/ PARTIAL MEDIAL MENISCECTOMY Left 09/23/2021   LAPAROSCOPIC APPENDECTOMY N/A 09/13/2012   Procedure: APPENDECTOMY LAPAROSCOPIC;  Surgeon: Liz Malady, MD;  Location: [facility] OR;  Service: General;  Laterality: N/A;   Patient Active Problem List   Diagnosis Date Noted   Appendicitis, acute 09/13/2012    REFERRING DIAG: S/P 09/23/2021 partial medial meniscectomy and plica excision  THERAPY DIAG:  Chronic pain of left knee  Difficulty in walking, not elsewhere classified  Muscle weakness (generalized)  Localized edema  Rationale for Evaluation and Treatment Rehabilitation  PERTINENT HISTORY: S/P 09/23/2021 partial medial meniscectomy and plica excision  PRECAUTIONS: Knee, in regard to post-op status, utilizing [facility] partial meniscectomy post-op protocol  SUBJECTIVE: Pt reports he has been walking about half a mile in the mornings and evening, only occasionally having mild pain. Pt reports daily adherence to his HEP.  PAIN:  Are you having pain? Yes: NPRS scale: 0/10 Pain location: Lt  knee Pain description: achy Aggravating factors: sitting > 1 hour, descending stairs Relieving factors: pain medication, ice   OBJECTIVE: (objective measures completed at initial evaluation unless otherwise dated)   DIAGNOSTIC FINDINGS: None available   PATIENT SURVEYS:  LEFS 50/80   COGNITION:           Overall cognitive status: Within functional limits for tasks assessed                          SENSATION: Not tested   EDEMA:  Circumferential: At inferior patellar pole: 41cm on Lt, 40cm on Rt   MUSCLE LENGTH: Hamstring 90/90 test: Right 30 deg shy of full extension; Left 60 deg shy of full extension Ely's test: (-) on Rt, not tested on Lt     PALPATION: TTP to medial joint line on Lt   PASSIVE ACCESSORY MOBILITY: Mild pain with Lt lateral patellar glide   LOWER EXTREMITY ROM:   A/PROM Right eval Left eval  Knee flexion 130/135 124/126 mild p!  Knee extension 0/2 -4/-2   (Blank rows = not tested)   LOWER EXTREMITY MMT:   MMT Right eval Left eval  Hip flexion 4+/5 4/5  Hip extension 4+/5 4/5  Hip abduction 4+/5 4/5  Knee flexion 5/5 4/5  Knee extension 5/5 4+/5   (Blank rows = not tested)     FUNCTIONAL TESTS:  Squat: 50% limited, posterior Lt knee p! 5xSTS: 18 seconds   GAIT: Distance walked: 20 feet Assistive device  utilized: None Level of assistance: Complete Independence Comments: Mild Lt antalgic gait with decreased Lt step length       TODAY'S TREATMENT:  [facility] Adult PT Treatment:                                                DATE: 10/20/2021 Therapeutic Exercise: Exercise bike with progressive lowering x5 minutes while collecting subjective information Half dead lifts with 10# kettlebell 3x10 Seated BIL knee extension machine with 15# from 90 degrees of flexion to 45 degrees of flexion 3x12 with 3-sec eccentric phase Supine Lt SLR with hip abduction with YTB around ankles 2x10 Side knee plank with hip abduction 2x10 BIL Seated Lt  active hamstring stretch x57min Manual Therapy: N/A Neuromuscular re-ed: N/A Therapeutic Activity: N/A Modalities: N/A Self Care: N/A      PATIENT EDUCATION:  Education details: Pt educated on underlying pathophysiology, POC, prognosis, LEFS, and HEP Person educated: Patient Education method: Explanation, Demonstration, and Handouts Education comprehension: verbalized understanding and returned demonstration     HOME EXERCISE PROGRAM: Access Code: [email or web address] URL: [email or web address] Date: 10/13/2021 Prepared by: Carmelina Dane   Exercises - Lateral Step Up (Mirrored)  - 1 x daily - 7 x weekly - 3 sets - 15 reps - Mini Squat with Counter Support  - 1 x daily - 7 x weekly - 3 sets - 10 reps - Seated Hamstring Stretch  - 1 x daily - 7 x weekly - 2 sets - 1 minute hold - Side Plank on Knees  - 1 x daily - 7 x weekly - 3 sets - 10 reps - 3 seconds hold   ASSESSMENT:   CLINICAL IMPRESSION: Pt presents 3 weeks and 6 days s/p Lt partial medial meniscectomy and plica excision on 09/23/2021.  He responded well to all exercises introduced today, following [facility] partial meniscectomy post-op protocol. He will continue to benefit from skilled PT to address his primary impairments and return to his prior level of function with less limitation.      OBJECTIVE IMPAIRMENTS Abnormal gait, decreased balance, decreased endurance, decreased mobility, difficulty walking, decreased ROM, decreased strength, hypomobility, increased edema, impaired flexibility, improper body mechanics, postural dysfunction, and pain.    ACTIVITY LIMITATIONS bending, sitting, stairs, transfers, toileting, dressing, and locomotion level   PARTICIPATION LIMITATIONS: cleaning, laundry, driving, shopping, community activity, occupation, and yard work   PERSONAL FACTORS  N/A  are also affecting patient's functional outcome.        GOALS: Goals reviewed with patient? Yes    SHORT TERM GOALS: Target date: 11/10/2021  Pt will report understanding and adherence to initial HEP in order to promote independence in the management of primary impairments. Baseline: Pt provided HEP at eval Goal status: INITIAL       LONG TERM GOALS: Target date: 12/08/2021    Pt will achieve an LEFS score of 65/80 in order to demonstrate improved functional ability as it relates to his knee impairments. Baseline: 50/80 Goal status: INITIAL   2.  Pt will achieve Lt knee AROM from 0-130 in order to achieve a normalized gait pattern for community ambulation. Baseline: -4-124 Goal status: INITIAL   3.  Pt will report ability to sit >2 hours with 0/10 pain in order to complete his job duties with less limitation. Baseline: >2/10 pain with 1 hour of sitting Goal status: INITIAL  4.  Pt will achieve BIL global LE strength of 5/5 in order to progress his independent LE strengthening regimen with less limitation.  Baseline: See MMT chart Goal status: INITIAL   5.  Pt will demonstrate 5 WNL depth squats with 0/10 pain in order to pick up groceries from the floor with less limitation. Baseline: 50% limited squat with 2/10 pain Goal status: INITIAL       PLAN: PT FREQUENCY: 1x/week   PT DURATION: 8 weeks   PLANNED INTERVENTIONS: Therapeutic exercises, Therapeutic activity, Neuromuscular re-education, Balance training, Gait training, Patient/Family education, Self Care, Joint mobilization, Stair training, Aquatic Therapy, Dry Needling, Electrical stimulation, Cryotherapy, Compression bandaging, Taping, Vasopneumatic device, Biofeedback, Ionotophoresis 4mg /ml Dexamethasone, Manual therapy, and Re-evaluation   PLAN FOR NEXT SESSION: Progress Lt knee strengthening/ ROM in accordance with [facility] partial meniscectomy post-op protocol   [facility], PT, DPT 10/20/21 12:55 PM

## 2021-10-27 ENCOUNTER — Encounter: Payer: Self-pay | Admitting: Physical Therapy

## 2021-10-27 ENCOUNTER — Ambulatory Visit: Payer: Medicaid Other | Admitting: Physical Therapy

## 2021-10-27 DIAGNOSIS — G8929 Other chronic pain: Secondary | ICD-10-CM

## 2021-10-27 DIAGNOSIS — R262 Difficulty in walking, not elsewhere classified: Secondary | ICD-10-CM

## 2021-10-27 DIAGNOSIS — M25562 Pain in left knee: Secondary | ICD-10-CM | POA: Diagnosis not present

## 2021-10-27 DIAGNOSIS — M6281 Muscle weakness (generalized): Secondary | ICD-10-CM

## 2021-10-27 NOTE — Therapy (Signed)
OUTPATIENT PHYSICAL THERAPY TREATMENT NOTE   Patient Name: Sean Ingram MRN: [id number] DOB:Jun 03, 1990, 31 y.o., male Today's Date: 10/27/2021  PCP: No PCP REFERRING PROVIDER: Jenne Pane, PA-C  END OF SESSION:   PT End of Session - 10/27/21 1359     Visit Number 3    Number of Visits 9    Date for PT Re-Evaluation 12/15/21    Authorization Type [employer]    Authorization Time Period 10/14/2021-01/11/2022    Authorization - Visit Number 1    Authorization - Number of Visits 10    PT Start Time 1400    PT Stop Time 1441    PT Time Calculation (min) 41 min    Activity Tolerance Patient tolerated treatment well    Behavior During Therapy [facility] for tasks assessed/performed             History reviewed. No pertinent past medical history. Past Surgical History:  Procedure Laterality Date   APPENDECTOMY  09/12/2012   KNEE ARTHROSCOPY W/ PARTIAL MEDIAL MENISCECTOMY Left 09/23/2021   LAPAROSCOPIC APPENDECTOMY N/A 09/13/2012   Procedure: APPENDECTOMY LAPAROSCOPIC;  Surgeon: Liz Malady, MD;  Location: [facility] OR;  Service: General;  Laterality: N/A;   Patient Active Problem List   Diagnosis Date Noted   Appendicitis, acute 09/13/2012    REFERRING DIAG: S/P 09/23/2021 partial medial meniscectomy and plica excision  THERAPY DIAG:  Chronic pain of left knee  Difficulty in walking, not elsewhere classified  Muscle weakness (generalized)  Rationale for Evaluation and Treatment Rehabilitation  PERTINENT HISTORY: S/P 09/23/2021 partial medial meniscectomy and plica excision  PRECAUTIONS: Knee, in regard to post-op status, utilizing [facility] partial meniscectomy post-op protocol  SUBJECTIVE: Pt reports that he felt good after last visit.  He is having minimal pain.  PAIN:  Are you having pain? Yes: NPRS scale: 2/10 Pain location: Lt knee Pain description: achy Aggravating factors: sitting > 1 hour, descending stairs Relieving  factors: pain medication, ice   OBJECTIVE: (objective measures completed at initial evaluation unless otherwise dated)   DIAGNOSTIC FINDINGS: None available   PATIENT SURVEYS:  LEFS 50/80   COGNITION:           Overall cognitive status: Within functional limits for tasks assessed                          SENSATION: Not tested   EDEMA:  Circumferential: At inferior patellar pole: 41cm on Lt, 40cm on Rt   MUSCLE LENGTH: Hamstring 90/90 test: Right 30 deg shy of full extension; Left 60 deg shy of full extension Ely's test: (-) on Rt, not tested on Lt     PALPATION: TTP to medial joint line on Lt   PASSIVE ACCESSORY MOBILITY: Mild pain with Lt lateral patellar glide   LOWER EXTREMITY ROM:   A/PROM Right eval Left eval  Knee flexion 130/135 124/126 mild p!  Knee extension 0/2 -4/-2   (Blank rows = not tested)   LOWER EXTREMITY MMT:   MMT Right eval Left eval  Hip flexion 4+/5 4/5  Hip extension 4+/5 4/5  Hip abduction 4+/5 4/5  Knee flexion 5/5 4/5  Knee extension 5/5 4+/5   (Blank rows = not tested)     FUNCTIONAL TESTS:  Squat: 50% limited, posterior Lt knee p! 5xSTS: 18 seconds   GAIT: Distance walked: 20 feet Assistive device utilized: None Level of assistance: Complete Independence Comments: Mild Lt antalgic gait with decreased Lt  step length       TODAY'S TREATMENT:   [facility] Adult PT Treatment:                                                DATE: 10/27/2021 Therapeutic Exercise: Exercise bike with progressive lowering x5 minutes while collecting subjective information Slant board stretch 45'' x3 Heel raises on step - 3x10  BFR 130 mmHg 30-15-15 protocol pain free LAQ (0#) then SLR  Neuromuscular re-ed: Tandem on foam SLS SLS on foam   [facility] Adult PT Treatment:                                                DATE: 10/20/2021 Therapeutic Exercise: Exercise bike with progressive lowering x5 minutes while collecting subjective  information Half dead lifts with 10# kettlebell 3x10 Seated BIL knee extension machine with 15# from 90 degrees of flexion to 45 degrees of flexion 3x12 with 3-sec eccentric phase Supine Lt SLR with hip abduction with YTB around ankles 2x10 Side knee plank with hip abduction 2x10 BIL Seated Lt active hamstring stretch x12min Manual Therapy: N/A Neuromuscular re-ed: N/A Therapeutic Activity: N/A Modalities: N/A Self Care: N/A      PATIENT EDUCATION:  Education details: Pt educated on underlying pathophysiology, POC, prognosis, LEFS, and HEP Person educated: Patient Education method: Explanation, Demonstration, and Handouts Education comprehension: verbalized understanding and returned demonstration     HOME EXERCISE PROGRAM: Access Code: [email or web address] URL: [email or web address] Date: 10/13/2021 Prepared by: Carmelina Dane   Exercises - Lateral Step Up (Mirrored)  - 1 x daily - 7 x weekly - 3 sets - 15 reps - Mini Squat with Counter Support  - 1 x daily - 7 x weekly - 3 sets - 10 reps - Seated Hamstring Stretch  - 1 x daily - 7 x weekly - 2 sets - 1 minute hold - Side Plank on Knees  - 1 x daily - 7 x weekly - 3 sets - 10 reps - 3 seconds hold   ASSESSMENT:   CLINICAL IMPRESSION: Fitz tolerated session well with no adverse reaction.  We continued appropriate knee and hip strengthening.  Began integrating some balance exercises.  Pt tolerated BFR with no issue.  Will continue to progress as appropriate.     OBJECTIVE IMPAIRMENTS Abnormal gait, decreased balance, decreased endurance, decreased mobility, difficulty walking, decreased ROM, decreased strength, hypomobility, increased edema, impaired flexibility, improper body mechanics, postural dysfunction, and pain.    ACTIVITY LIMITATIONS bending, sitting, stairs, transfers, toileting, dressing, and locomotion level   PARTICIPATION LIMITATIONS: cleaning, laundry, driving, shopping, community activity,  occupation, and yard work   PERSONAL FACTORS  N/A  are also affecting patient's functional outcome.        GOALS: Goals reviewed with patient? Yes   SHORT TERM GOALS: Target date: 11/10/2021  Pt will report understanding and adherence to initial HEP in order to promote independence in the management of primary impairments. Baseline: Pt provided HEP at eval Goal status: INITIAL       LONG TERM GOALS: Target date: 12/08/2021    Pt will achieve an LEFS score of 65/80 in order to demonstrate improved functional ability as it relates to his knee impairments. Baseline: 50/80 Goal status: INITIAL  2.  Pt will achieve Lt knee AROM from 0-130 in order to achieve a normalized gait pattern for community ambulation. Baseline: -4-124 Goal status: INITIAL   3.  Pt will report ability to sit >2 hours with 0/10 pain in order to complete his job duties with less limitation. Baseline: >2/10 pain with 1 hour of sitting Goal status: INITIAL   4.  Pt will achieve BIL global LE strength of 5/5 in order to progress his independent LE strengthening regimen with less limitation.  Baseline: See MMT chart Goal status: INITIAL   5.  Pt will demonstrate 5 WNL depth squats with 0/10 pain in order to pick up groceries from the floor with less limitation. Baseline: 50% limited squat with 2/10 pain Goal status: INITIAL       PLAN: PT FREQUENCY: 1x/week   PT DURATION: 8 weeks   PLANNED INTERVENTIONS: Therapeutic exercises, Therapeutic activity, Neuromuscular re-education, Balance training, Gait training, Patient/Family education, Self Care, Joint mobilization, Stair training, Aquatic Therapy, Dry Needling, Electrical stimulation, Cryotherapy, Compression bandaging, Taping, Vasopneumatic device, Biofeedback, Ionotophoresis 4mg /ml Dexamethasone, Manual therapy, and Re-evaluation   PLAN FOR NEXT SESSION: Progress Lt knee strengthening/ ROM in accordance with [facility] partial  meniscectomy post-op protocol   [place] [facility] PT 10/27/21 2:43 PM

## 2021-11-03 ENCOUNTER — Ambulatory Visit: Payer: Medicaid Other

## 2021-11-03 DIAGNOSIS — G8929 Other chronic pain: Secondary | ICD-10-CM

## 2021-11-03 DIAGNOSIS — M6281 Muscle weakness (generalized): Secondary | ICD-10-CM

## 2021-11-03 DIAGNOSIS — M25562 Pain in left knee: Secondary | ICD-10-CM | POA: Diagnosis not present

## 2021-11-03 DIAGNOSIS — R262 Difficulty in walking, not elsewhere classified: Secondary | ICD-10-CM

## 2021-11-03 DIAGNOSIS — R600 Localized edema: Secondary | ICD-10-CM

## 2021-11-03 NOTE — Therapy (Signed)
OUTPATIENT PHYSICAL THERAPY TREATMENT NOTE   Patient Name: Sean Ingram MRN: [id number] DOB:07/18/1990, 31 y.o., male Today's Date: 11/03/2021  PCP: No PCP REFERRING PROVIDER: Britt Bottom, PA-C  END OF SESSION:   PT End of Session - 11/03/21 1350     Visit Number 4    Number of Visits 9    Date for PT Re-Evaluation 12/15/21    Authorization Type [employer]    Authorization Time Period 10/14/2021-01/11/2022    Authorization - Visit Number 3    Authorization - Number of Visits 10    PT Start Time 8185    PT Stop Time 1438    PT Time Calculation (min) 43 min    Activity Tolerance Patient tolerated treatment well    Behavior During Therapy [facility] for tasks assessed/performed              History reviewed. No pertinent past medical history. Past Surgical History:  Procedure Laterality Date   APPENDECTOMY  09/12/2012   KNEE ARTHROSCOPY W/ PARTIAL MEDIAL MENISCECTOMY Left 09/23/2021   LAPAROSCOPIC APPENDECTOMY N/A 09/13/2012   Procedure: APPENDECTOMY LAPAROSCOPIC;  Surgeon: Zenovia Jarred, MD;  Location: [place];  Service: General;  Laterality: N/A;   Patient Active Problem List   Diagnosis Date Noted   Appendicitis, acute 09/13/2012    REFERRING DIAG: S/P 09/23/2021 partial medial meniscectomy and plica excision  THERAPY DIAG:  Chronic pain of left knee  Difficulty in walking, not elsewhere classified  Muscle weakness (generalized)  Localized edema  Rationale for Evaluation and Treatment Rehabilitation  PERTINENT HISTORY: S/P 09/23/2021 partial Lt medial meniscectomy and plica excision  PRECAUTIONS: Knee, in regard to post-op status, utilizing [facility] partial meniscectomy post-op protocol  SUBJECTIVE: Pt reports adherence to his HEP. He also reports increased walking, although he reports Lt knee tightness with prolonged walking. Pt denies any pain currently.   PAIN:  Are you having pain? Yes: NPRS scale: 0/10 Pain  location: Lt knee Pain description: achy Aggravating factors: sitting > 1 hour, descending stairs Relieving factors: pain medication, ice   OBJECTIVE: (objective measures completed at initial evaluation unless otherwise dated)   DIAGNOSTIC FINDINGS: None available   PATIENT SURVEYS:  LEFS 50/80   COGNITION:           Overall cognitive status: Within functional limits for tasks assessed                          SENSATION: Not tested   EDEMA:  Circumferential: At inferior patellar pole: 41cm on Lt, 40cm on Rt   MUSCLE LENGTH: Hamstring 90/90 test: Right 30 deg shy of full extension; Left 60 deg shy of full extension Ely's test: (-) on Rt, not tested on Lt     PALPATION: TTP to medial joint line on Lt   PASSIVE ACCESSORY MOBILITY: Mild pain with Lt lateral patellar glide   LOWER EXTREMITY ROM:   A/PROM Right eval Left eval Left 11/03/2021  Knee flexion 130/135 124/126 mild p! 135  Knee extension 0/2 -4/-2 -2/0   (Blank rows = not tested)   LOWER EXTREMITY MMT:   MMT Right eval Left eval  Hip flexion 4+/5 4/5  Hip extension 4+/5 4/5  Hip abduction 4+/5 4/5  Knee flexion 5/5 4/5  Knee extension 5/5 4+/5   (Blank rows = not tested)     FUNCTIONAL TESTS:  Squat: 50% limited, posterior Lt knee p! 5xSTS: 18 seconds   GAIT: Distance  walked: 20 feet Assistive device utilized: None Level of assistance: Complete Independence Comments: Mild Lt antalgic gait with decreased Lt step length       TODAY'S TREATMENT:  [facility] Adult PT Treatment:                                                DATE: 11/03/2021 Therapeutic Exercise: Supine Lt knee TKE with heel on bolster x10 with 10-sec holds Supine Lt knee flexion AAROM with strap x10 with 5-sec hold at end-range [place] split squat with UE support 3x10 BIL Tall-kneeling hip thrusts on Airex pad with 23# cable to waist attachment 3x12 Side knee plank with hip abduction 2x10 BIL Kickstand stance on edge of Airex  pad with alternating lateral arm raises with 1kg ball 2x20 BIL Squat into overhead reach with two 5# dumbbells with heel raise 3x10 Seated active hamstring stretch x60min BIL Manual Therapy: N/A Neuromuscular re-ed: N/A Therapeutic Activity: N/A Modalities: N/A Self Care: N/A   [facility] Adult PT Treatment:                                                DATE: 10/27/2021 Therapeutic Exercise: Exercise bike with progressive lowering x5 minutes while collecting subjective information Slant board stretch 45'' x3 Heel raises on step - 3x10  BFR 130 mmHg 30-15-15 protocol pain free LAQ (0#) then SLR  Neuromuscular re-ed: Tandem on foam SLS SLS on foam   [facility] Adult PT Treatment:                                                DATE: 10/20/2021 Therapeutic Exercise: Exercise bike with progressive lowering x5 minutes while collecting subjective information Half dead lifts with 10# kettlebell 3x10 Seated BIL knee extension machine with 15# from 90 degrees of flexion to 45 degrees of flexion 3x12 with 3-sec eccentric phase Supine Lt SLR with hip abduction with YTB around ankles 2x10 Side knee plank with hip abduction 2x10 BIL Seated Lt active hamstring stretch x77min Manual Therapy: N/A Neuromuscular re-ed: N/A Therapeutic Activity: N/A Modalities: N/A Self Care: N/A      PATIENT EDUCATION:  Education details: Pt educated on underlying pathophysiology, POC, prognosis, LEFS, and HEP Person educated: Patient Education method: Explanation, Demonstration, and Handouts Education comprehension: verbalized understanding and returned demonstration     HOME EXERCISE PROGRAM: Access Code: [email or web address] URL: [email or web address] Date: 10/13/2021 Prepared by: Carmelina Dane   Exercises - Lateral Step Up (Mirrored)  - 1 x daily - 7 x weekly - 3 sets - 15 reps - Mini Squat with Counter Support  - 1 x daily - 7 x weekly - 3 sets - 10 reps - Seated Hamstring Stretch  - 1 x  daily - 7 x weekly - 2 sets - 1 minute hold - Side Plank on Knees  - 1 x daily - 7 x weekly - 3 sets - 10 reps - 3 seconds hold   ASSESSMENT:   CLINICAL IMPRESSION: Pt continues to make good progress in PT, demonstrating ability to progress to single-leg closed-chain exercises with good form and no pain. Additionally,  re-assessment of Lt knee AROM indicated improved global AROM. Pt will continue to benefit from skilled PT to address her primary impairments and return to his prior level of function with less limitation.     OBJECTIVE IMPAIRMENTS Abnormal gait, decreased balance, decreased endurance, decreased mobility, difficulty walking, decreased ROM, decreased strength, hypomobility, increased edema, impaired flexibility, improper body mechanics, postural dysfunction, and pain.    ACTIVITY LIMITATIONS bending, sitting, stairs, transfers, toileting, dressing, and locomotion level   PARTICIPATION LIMITATIONS: cleaning, laundry, driving, shopping, community activity, occupation, and yard work   PERSONAL FACTORS  N/A  are also affecting patient's functional outcome.        GOALS: Goals reviewed with patient? Yes   SHORT TERM GOALS: Target date: 11/10/2021  Pt will report understanding and adherence to initial HEP in order to promote independence in the management of primary impairments. Baseline: Pt provided HEP at eval 11/03/2021: Pt demonstrates adherence to his HEP Goal status: ACHIEVED       LONG TERM GOALS: Target date: 12/08/2021    Pt will achieve an LEFS score of 65/80 in order to demonstrate improved functional ability as it relates to his knee impairments. Baseline: 50/80 Goal status: INITIAL   2.  Pt will achieve Lt knee AROM from 0-130 in order to achieve a normalized gait pattern for community ambulation. Baseline: -4-124 11/03/2021: -2-135 Goal status: IN PROGRESS   3.  Pt will report ability to sit >2 hours with 0/10 pain in order to complete his job duties with  less limitation. Baseline: >2/10 pain with 1 hour of sitting Goal status: INITIAL   4.  Pt will achieve BIL global LE strength of 5/5 in order to progress his independent LE strengthening regimen with less limitation.  Baseline: See MMT chart Goal status: INITIAL   5.  Pt will demonstrate 5 WNL depth squats with 0/10 pain in order to pick up groceries from the floor with less limitation. Baseline: 50% limited squat with 2/10 pain Goal status: INITIAL       PLAN: PT FREQUENCY: 1x/week   PT DURATION: 8 weeks   PLANNED INTERVENTIONS: Therapeutic exercises, Therapeutic activity, Neuromuscular re-education, Balance training, Gait training, Patient/Family education, Self Care, Joint mobilization, Stair training, Aquatic Therapy, Dry Needling, Electrical stimulation, Cryotherapy, Compression bandaging, Taping, Vasopneumatic device, Biofeedback, Ionotophoresis 4mg /ml Dexamethasone, Manual therapy, and Re-evaluation   PLAN FOR NEXT SESSION: Progress Lt knee strengthening/ ROM in accordance with [facility] partial meniscectomy post-op protocol  [facility], PT, DPT 11/03/21 2:38 PM

## 2021-11-10 ENCOUNTER — Ambulatory Visit: Payer: Medicaid Other

## 2021-11-10 DIAGNOSIS — R600 Localized edema: Secondary | ICD-10-CM

## 2021-11-10 DIAGNOSIS — M6281 Muscle weakness (generalized): Secondary | ICD-10-CM

## 2021-11-10 DIAGNOSIS — R262 Difficulty in walking, not elsewhere classified: Secondary | ICD-10-CM

## 2021-11-10 DIAGNOSIS — G8929 Other chronic pain: Secondary | ICD-10-CM

## 2021-11-10 DIAGNOSIS — M25562 Pain in left knee: Secondary | ICD-10-CM | POA: Diagnosis not present

## 2021-11-10 NOTE — Therapy (Signed)
OUTPATIENT PHYSICAL THERAPY TREATMENT NOTE   Patient Name: Sean Ingram MRN: [id number] DOB:03-Aug-1990, 31 y.o., male Today's Date: 11/10/2021  PCP: No PCP REFERRING PROVIDER: Britt Bottom, PA-C  END OF SESSION:   PT End of Session - 11/10/21 1352     Visit Number 5    Number of Visits 9    Date for PT Re-Evaluation 12/15/21    Authorization Type [employer]    Authorization Time Period 10/14/2021-01/11/2022    Authorization - Visit Number 4    Authorization - Number of Visits 10    PT Start Time 3825    PT Stop Time 1436    PT Time Calculation (min) 42 min    Activity Tolerance Patient tolerated treatment well    Behavior During Therapy [facility] for tasks assessed/performed               History reviewed. No pertinent past medical history. Past Surgical History:  Procedure Laterality Date   APPENDECTOMY  09/12/2012   KNEE ARTHROSCOPY W/ PARTIAL MEDIAL MENISCECTOMY Left 09/23/2021   LAPAROSCOPIC APPENDECTOMY N/A 09/13/2012   Procedure: APPENDECTOMY LAPAROSCOPIC;  Surgeon: Zenovia Jarred, MD;  Location: [place];  Service: General;  Laterality: N/A;   Patient Active Problem List   Diagnosis Date Noted   Appendicitis, acute 09/13/2012    REFERRING DIAG: S/P 09/23/2021 partial medial meniscectomy and plica excision  THERAPY DIAG:  Chronic pain of left knee  Difficulty in walking, not elsewhere classified  Muscle weakness (generalized)  Localized edema  Rationale for Evaluation and Treatment Rehabilitation  PERTINENT HISTORY: S/P 09/23/2021 partial Lt medial meniscectomy and plica excision  PRECAUTIONS: Knee, in regard to post-op status, utilizing [facility] partial meniscectomy post-op protocol  SUBJECTIVE: Pt reports his knee continues to feel better and better, reporting only mild tightness with walking.  PAIN:  Are you having pain? Yes: NPRS scale: 0/10 Pain location: Lt knee Pain description: achy Aggravating factors:  sitting > 1 hour, descending stairs Relieving factors: pain medication, ice   OBJECTIVE: (objective measures completed at initial evaluation unless otherwise dated)   DIAGNOSTIC FINDINGS: None available   PATIENT SURVEYS:  LEFS 50/80   COGNITION:           Overall cognitive status: Within functional limits for tasks assessed                          SENSATION: Not tested   EDEMA:  Circumferential: At inferior patellar pole: 41cm on Lt, 40cm on Rt   MUSCLE LENGTH: Hamstring 90/90 test: Right 30 deg shy of full extension; Left 60 deg shy of full extension Ely's test: (-) on Rt, not tested on Lt     PALPATION: TTP to medial joint line on Lt   PASSIVE ACCESSORY MOBILITY: Mild pain with Lt lateral patellar glide   LOWER EXTREMITY ROM:   A/PROM Right eval Left eval Left 11/03/2021  Knee flexion 130/135 124/126 mild p! 135  Knee extension 0/2 -4/-2 -2/0   (Blank rows = not tested)   LOWER EXTREMITY MMT:   MMT Right eval Left eval  Hip flexion 4+/5 4/5  Hip extension 4+/5 4/5  Hip abduction 4+/5 4/5  Knee flexion 5/5 4/5  Knee extension 5/5 4+/5   (Blank rows = not tested)     FUNCTIONAL TESTS:  Squat: 50% limited, posterior Lt knee p! 5xSTS: 18 seconds  11/10/2021: 5xSTS: 11.5 seconds   GAIT: Distance walked: 20 feet Assistive  device utilized: None Level of assistance: Complete Independence Comments: Mild Lt antalgic gait with decreased Lt step length       TODAY'S TREATMENT:  [facility] Adult PT Treatment:                                                DATE: 11/10/2021 Therapeutic Exercise: Exercise bike x5 minutes while collecting subjective information [place] split squat with 5# kettlebell and UE support 3x10 BIL Side knee planks with hip abduction 2x10 BIL Tall-kneeling hip thrusts on Airex pad with 23# cable to waist attachment 3x12 Assisted Nordic hamstring curls with 23# cable assistance to chest attachment with knees on Airex pad and lowering  forward to two Airex pads 3x10 Kickstand stance on Airex pad with front foot on edge of pad and alternating lateral arm raises with 5# kettlebell 3x20 on Lt Seated BIL active hamstring stretch x54min Prone BIL quad stretch with sheet x37min Manual Therapy: N/A Neuromuscular re-ed: N/A Therapeutic Activity: N/A Modalities: N/A Self Care: N/A   [facility] Adult PT Treatment:                                                DATE: 11/03/2021 Therapeutic Exercise: Supine Lt knee TKE with heel on bolster x10 with 10-sec holds Supine Lt knee flexion AAROM with strap x10 with 5-sec hold at end-range [place] split squat with UE support 3x10 BIL Tall-kneeling hip thrusts on Airex pad with 23# cable to waist attachment 3x12 Side knee plank with hip abduction 2x10 BIL Kickstand stance on edge of Airex pad with alternating lateral arm raises with 1kg ball 2x20 BIL Squat into overhead reach with two 5# dumbbells with heel raise 3x10 Seated active hamstring stretch x48min BIL Manual Therapy: N/A Neuromuscular re-ed: N/A Therapeutic Activity: N/A Modalities: N/A Self Care: N/A   [facility] Adult PT Treatment:                                                DATE: 10/27/2021 Therapeutic Exercise: Exercise bike with progressive lowering x5 minutes while collecting subjective information Slant board stretch 45'' x3 Heel raises on step - 3x10  BFR 130 mmHg 30-15-15 protocol pain free LAQ (0#) then SLR  Neuromuscular re-ed: Tandem on foam SLS SLS on foam      PATIENT EDUCATION:  Education details: Pt educated on underlying pathophysiology, POC, prognosis, LEFS, and HEP Person educated: Patient Education method: Explanation, Demonstration, and Handouts Education comprehension: verbalized understanding and returned demonstration     HOME EXERCISE PROGRAM: Access Code: [email or web address] URL: [email or web address] Date: 10/13/2021 Prepared by: Carmelina Dane   Exercises - Lateral Step  Up (Mirrored)  - 1 x daily - 7 x weekly - 3 sets - 15 reps - Mini Squat with Counter Support  - 1 x daily - 7 x weekly - 3 sets - 10 reps - Seated Hamstring Stretch  - 1 x daily - 7 x weekly - 2 sets - 1 minute hold - Side Plank on Knees  - 1 x daily - 7 x weekly - 3 sets - 10 reps - 3 seconds  hold   ASSESSMENT:   CLINICAL IMPRESSION: Pt presents 6 weeks and 6 days s/p partial Lt medial meniscectomy and plica excision on 09/23/2021. Pt continues to respond well to progressed knee and hip strengthening. Upon re-assessment of 5xSTS, the pt has made a 6.5-second improvement since eval. He will continue to benefit from skilled PT to address his primary impairments and return to his prior level of function with less limitation.     OBJECTIVE IMPAIRMENTS Abnormal gait, decreased balance, decreased endurance, decreased mobility, difficulty walking, decreased ROM, decreased strength, hypomobility, increased edema, impaired flexibility, improper body mechanics, postural dysfunction, and pain.    ACTIVITY LIMITATIONS bending, sitting, stairs, transfers, toileting, dressing, and locomotion level   PARTICIPATION LIMITATIONS: cleaning, laundry, driving, shopping, community activity, occupation, and yard work   PERSONAL FACTORS  N/A  are also affecting patient's functional outcome.        GOALS: Goals reviewed with patient? Yes   SHORT TERM GOALS: Target date: 11/10/2021  Pt will report understanding and adherence to initial HEP in order to promote independence in the management of primary impairments. Baseline: Pt provided HEP at eval 11/03/2021: Pt demonstrates adherence to his HEP Goal status: ACHIEVED       LONG TERM GOALS: Target date: 12/08/2021    Pt will achieve an LEFS score of 65/80 in order to demonstrate improved functional ability as it relates to his knee impairments. Baseline: 50/80 Goal status: INITIAL   2.  Pt will achieve Lt knee AROM from 0-130 in order to achieve a normalized  gait pattern for community ambulation. Baseline: -4-124 11/03/2021: -2-135 Goal status: IN PROGRESS   3.  Pt will report ability to sit >2 hours with 0/10 pain in order to complete his job duties with less limitation. Baseline: >2/10 pain with 1 hour of sitting Goal status: INITIAL   4.  Pt will achieve BIL global LE strength of 5/5 in order to progress his independent LE strengthening regimen with less limitation.  Baseline: See MMT chart Goal status: INITIAL   5.  Pt will demonstrate 5 WNL depth squats with 0/10 pain in order to pick up groceries from the floor with less limitation. Baseline: 50% limited squat with 2/10 pain Goal status: INITIAL       PLAN: PT FREQUENCY: 1x/week   PT DURATION: 8 weeks   PLANNED INTERVENTIONS: Therapeutic exercises, Therapeutic activity, Neuromuscular re-education, Balance training, Gait training, Patient/Family education, Self Care, Joint mobilization, Stair training, Aquatic Therapy, Dry Needling, Electrical stimulation, Cryotherapy, Compression bandaging, Taping, Vasopneumatic device, Biofeedback, Ionotophoresis 4mg /ml Dexamethasone, Manual therapy, and Re-evaluation   PLAN FOR NEXT SESSION: Progress Lt knee strengthening/ ROM in accordance with [facility] partial meniscectomy post-op protocol  [facility], PT, DPT 11/10/21 2:38 PM

## 2021-11-16 ENCOUNTER — Ambulatory Visit: Payer: Medicaid Other | Attending: Orthopedic Surgery | Admitting: Physical Therapy

## 2021-11-16 ENCOUNTER — Encounter: Payer: Self-pay | Admitting: Physical Therapy

## 2021-11-16 DIAGNOSIS — R262 Difficulty in walking, not elsewhere classified: Secondary | ICD-10-CM | POA: Insufficient documentation

## 2021-11-16 DIAGNOSIS — M6281 Muscle weakness (generalized): Secondary | ICD-10-CM | POA: Diagnosis present

## 2021-11-16 DIAGNOSIS — G8929 Other chronic pain: Secondary | ICD-10-CM | POA: Diagnosis present

## 2021-11-16 DIAGNOSIS — R600 Localized edema: Secondary | ICD-10-CM | POA: Diagnosis present

## 2021-11-16 DIAGNOSIS — M25562 Pain in left knee: Secondary | ICD-10-CM | POA: Diagnosis present

## 2021-11-16 NOTE — Therapy (Signed)
OUTPATIENT PHYSICAL THERAPY TREATMENT NOTE   Patient Name: Sean Ingram MRN: [id number] DOB:1990/11/21, 31 y.o., male Today's Date: 11/16/2021  PCP: No PCP REFERRING PROVIDER: Britt Bottom, PA-C  END OF SESSION:   PT End of Session - 11/16/21 1126     Visit Number 6    Number of Visits 9    Date for PT Re-Evaluation 12/15/21    Authorization Type [employer]    Authorization Time Period 10/14/2021-01/11/2022    Authorization - Visit Number 6    Authorization - Number of Visits 10    PT Start Time 1130    PT Stop Time 1211    PT Time Calculation (min) 41 min    Activity Tolerance Patient tolerated treatment well    Behavior During Therapy [facility] for tasks assessed/performed               History reviewed. No pertinent past medical history. Past Surgical History:  Procedure Laterality Date   APPENDECTOMY  09/12/2012   KNEE ARTHROSCOPY W/ PARTIAL MEDIAL MENISCECTOMY Left 09/23/2021   LAPAROSCOPIC APPENDECTOMY N/A 09/13/2012   Procedure: APPENDECTOMY LAPAROSCOPIC;  Surgeon: Zenovia Jarred, MD;  Location: [place];  Service: General;  Laterality: N/A;   Patient Active Problem List   Diagnosis Date Noted   Appendicitis, acute 09/13/2012    REFERRING DIAG: S/P 09/23/2021 partial medial meniscectomy and plica excision  THERAPY DIAG:  Chronic pain of left knee  Difficulty in walking, not elsewhere classified  Muscle weakness (generalized)  Localized edema  Rationale for Evaluation and Treatment Rehabilitation  PERTINENT HISTORY: S/P 09/23/2021 partial Lt medial meniscectomy and plica excision  PRECAUTIONS: Knee, in regard to post-op status, utilizing [facility] partial meniscectomy post-op protocol  SUBJECTIVE: Pt reports his knee continues to feel better and better, reporting only mild tightness with walking.  PAIN:  Are you having pain? Yes: NPRS scale: 0/10 Pain location: Lt knee Pain description: achy Aggravating factors:  sitting > 1 hour, descending stairs Relieving factors: pain medication, ice   OBJECTIVE: (objective measures completed at initial evaluation unless otherwise dated)   DIAGNOSTIC FINDINGS: None available   PATIENT SURVEYS:  LEFS 50/80   COGNITION:           Overall cognitive status: Within functional limits for tasks assessed                          SENSATION: Not tested   EDEMA:  Circumferential: At inferior patellar pole: 41cm on Lt, 40cm on Rt   MUSCLE LENGTH: Hamstring 90/90 test: Right 30 deg shy of full extension; Left 60 deg shy of full extension Ely's test: (-) on Rt, not tested on Lt     PALPATION: TTP to medial joint line on Lt   PASSIVE ACCESSORY MOBILITY: Mild pain with Lt lateral patellar glide   LOWER EXTREMITY ROM:   A/PROM Right eval Left eval Left 11/03/2021  Knee flexion 130/135 124/126 mild p! 135  Knee extension 0/2 -4/-2 -2/0   (Blank rows = not tested)   LOWER EXTREMITY MMT:   MMT Right eval Left eval  Hip flexion 4+/5 4/5  Hip extension 4+/5 4/5  Hip abduction 4+/5 4/5  Knee flexion 5/5 4/5  Knee extension 5/5 4+/5   (Blank rows = not tested)     FUNCTIONAL TESTS:  Squat: 50% limited, posterior Lt knee p! 5xSTS: 18 seconds  11/10/2021: 5xSTS: 11.5 seconds   GAIT: Distance walked: 20 feet Assistive  device utilized: None Level of assistance: Complete Independence Comments: Mild Lt antalgic gait with decreased Lt step length       TODAY'S TREATMENT:  [facility] Adult PT Treatment:                                                DATE: 11/16/2021 Therapeutic Exercise: Exercise bike with progressive lowering x5 minutes while collecting subjective information Slant board stretch 45'' x3 Heel raises on step - 3x15 Wall squat - 30'' - x4 Lateral walking with band at toes - 4 laps Retro TM walking 1' x3 RDL to chair tap - 2x10 Seated BIL active hamstring stretch x34min    Neuromuscular re-ed: SLS on foam SLS with rebounder throw  - red ball - 2x20  [facility] Adult PT Treatment:                                                DATE: 11/10/2021 Therapeutic Exercise: Exercise bike x5 minutes while collecting subjective information [place] split squat with 5# kettlebell and UE support 3x10 BIL Side knee planks with hip abduction 2x10 BIL Tall-kneeling hip thrusts on Airex pad with 23# cable to waist attachment 3x12 Assisted Nordic hamstring curls with 23# cable assistance to chest attachment with knees on Airex pad and lowering forward to two Airex pads 3x10 Kickstand stance on Airex pad with front foot on edge of pad and alternating lateral arm raises with 5# kettlebell 3x20 on Lt Seated BIL active hamstring stretch x57min Prone BIL quad stretch with sheet x84min Manual Therapy: N/A Neuromuscular re-ed: N/A Therapeutic Activity: N/A Modalities: N/A Self Care: N/A   [facility] Adult PT Treatment:                                                DATE: 11/03/2021 Therapeutic Exercise: Supine Lt knee TKE with heel on bolster x10 with 10-sec holds Supine Lt knee flexion AAROM with strap x10 with 5-sec hold at end-range [place] split squat with UE support 3x10 BIL Tall-kneeling hip thrusts on Airex pad with 23# cable to waist attachment 3x12 Side knee plank with hip abduction 2x10 BIL Kickstand stance on edge of Airex pad with alternating lateral arm raises with 1kg ball 2x20 BIL Squat into overhead reach with two 5# dumbbells with heel raise 3x10 Seated active hamstring stretch x68min BIL Manual Therapy: N/A Neuromuscular re-ed: N/A Therapeutic Activity: N/A Modalities: N/A Self Care: N/A   [facility] Adult PT Treatment:                                                DATE: 10/27/2021 Therapeutic Exercise: Exercise bike with progressive lowering x5 minutes while collecting subjective information Slant board stretch 45'' x3 Heel raises on step - 3x10  BFR 130 mmHg 30-15-15 protocol pain free LAQ (0#) then  SLR  Neuromuscular re-ed: Tandem on foam SLS SLS on foam   HOME EXERCISE PROGRAM: Access Code: [email or web address] URL: [email or web address] Date: 10/13/2021 Prepared by:  Vanessa Sedgwick   Exercises - Lateral Step Up (Mirrored)  - 1 x daily - 7 x weekly - 3 sets - 15 reps - Mini Squat with Counter Support  - 1 x daily - 7 x weekly - 3 sets - 10 reps - Seated Hamstring Stretch  - 1 x daily - 7 x weekly - 2 sets - 1 minute hold - Side Plank on Knees  - 1 x daily - 7 x weekly - 3 sets - 10 reps - 3 seconds hold   ASSESSMENT:   CLINICAL IMPRESSION: Kathleen tolerated session well with no adverse reaction.  He continues to progress as expected following his surgery.  We concentrated on proprioception, quad strength and endurance, and general LE/core strength.  He is able to complete exercises with mod cuing for form an pacing.      OBJECTIVE IMPAIRMENTS Abnormal gait, decreased balance, decreased endurance, decreased mobility, difficulty walking, decreased ROM, decreased strength, hypomobility, increased edema, impaired flexibility, improper body mechanics, postural dysfunction, and pain.    ACTIVITY LIMITATIONS bending, sitting, stairs, transfers, toileting, dressing, and locomotion level   PARTICIPATION LIMITATIONS: cleaning, laundry, driving, shopping, community activity, occupation, and yard work   PERSONAL FACTORS  N/A  are also affecting patient's functional outcome.        GOALS: Goals reviewed with patient? Yes   SHORT TERM GOALS: Target date: 11/10/2021  Pt will report understanding and adherence to initial HEP in order to promote independence in the management of primary impairments. Baseline: Pt provided HEP at eval 11/03/2021: Pt demonstrates adherence to his HEP Goal status: ACHIEVED       LONG TERM GOALS: Target date: 12/08/2021    Pt will achieve an LEFS score of 65/80 in order to demonstrate improved functional ability as it relates to his knee  impairments. Baseline: 50/80 Goal status: INITIAL   2.  Pt will achieve Lt knee AROM from 0-130 in order to achieve a normalized gait pattern for community ambulation. Baseline: -4-124 11/03/2021: -2-135 Goal status: IN PROGRESS   3.  Pt will report ability to sit >2 hours with 0/10 pain in order to complete his job duties with less limitation. Baseline: >2/10 pain with 1 hour of sitting Goal status: INITIAL   4.  Pt will achieve BIL global LE strength of 5/5 in order to progress his independent LE strengthening regimen with less limitation.  Baseline: See MMT chart Goal status: INITIAL   5.  Pt will demonstrate 5 WNL depth squats with 0/10 pain in order to pick up groceries from the floor with less limitation. Baseline: 50% limited squat with 2/10 pain Goal status: INITIAL       PLAN: PT FREQUENCY: 1x/week   PT DURATION: 8 weeks   PLANNED INTERVENTIONS: Therapeutic exercises, Therapeutic activity, Neuromuscular re-education, Balance training, Gait training, Patient/Family education, Self Care, Joint mobilization, Stair training, Aquatic Therapy, Dry Needling, Electrical stimulation, Cryotherapy, Compression bandaging, Taping, Vasopneumatic device, Biofeedback, Ionotophoresis 4mg /ml Dexamethasone, Manual therapy, and Re-evaluation   PLAN FOR NEXT SESSION: Progress Lt knee strengthening/ ROM in accordance with [facility] partial meniscectomy post-op protocol  Mathis Dad PT 11/16/21 12:14 PM

## 2021-11-23 ENCOUNTER — Telehealth: Payer: Self-pay | Admitting: Physical Therapy

## 2021-11-23 ENCOUNTER — Ambulatory Visit: Payer: Medicaid Other | Admitting: Physical Therapy

## 2021-11-23 NOTE — Telephone Encounter (Signed)
Called and informed patient of missed visit and provided reminder of next appt and attendance policy.  

## 2021-11-30 ENCOUNTER — Ambulatory Visit: Payer: Medicaid Other

## 2021-12-07 ENCOUNTER — Ambulatory Visit: Payer: Medicaid Other

## 2021-12-12 ENCOUNTER — Encounter (HOSPITAL_COMMUNITY): Payer: Self-pay | Admitting: *Deleted

## 2021-12-12 ENCOUNTER — Ambulatory Visit (HOSPITAL_COMMUNITY)
Admission: EM | Admit: 2021-12-12 | Discharge: 2021-12-12 | Disposition: A | Discharge status: Home / Self Care | Payer: Medicaid Other | Attending: Family Medicine | Admitting: Family Medicine

## 2021-12-12 ENCOUNTER — Other Ambulatory Visit: Payer: Self-pay

## 2021-12-12 DIAGNOSIS — K644 Residual hemorrhoidal skin tags: Secondary | ICD-10-CM | POA: Diagnosis not present

## 2021-12-12 MED ORDER — KETOROLAC TROMETHAMINE 30 MG/ML IJ SOLN
30.0000 mg | Freq: Once | INTRAMUSCULAR | Status: AC
  Administered 2021-12-12: 30 mg via INTRAMUSCULAR

## 2021-12-12 MED ORDER — IBUPROFEN 800 MG PO TABS: 800.0000 mg | ORAL_TABLET | Freq: Three times a day (TID) | ORAL | 0 refills | Status: AC | PRN

## 2021-12-12 MED ORDER — KETOROLAC TROMETHAMINE 30 MG/ML IJ SOLN
INTRAMUSCULAR | Status: AC
  Filled 2021-12-12: qty 1

## 2021-12-12 MED ORDER — HYDROCORTISONE (PERIANAL) 2.5 % EX CREA: 1.0000 | TOPICAL_CREAM | Freq: Two times a day (BID) | CUTANEOUS | 0 refills | Status: DC

## 2021-12-12 NOTE — Discharge Instructions (Addendum)
You have been given a shot of Toradol 30 mg today.  Take ibuprofen 800 mg--1 tab every 8 hours as needed for pain.  Put hydrocortisone rectal cream on the hemorrhoid 2 times a day until its better   Putting an ice cube on the hemorrhoid can help how it feels  Please get MiraLAX over-the-counter and begin using it daily according to the package instructions.  If you can keep yourself from being constipated, it most likely will make your hemorrhoid not recur so often  Also eat more fruits and vegetables and drink plenty of water

## 2021-12-12 NOTE — ED Triage Notes (Signed)
PT reports rectal pain thst started this morning. Pt also has swelling at site.

## 2021-12-12 NOTE — ED Provider Notes (Signed)
[facility]    CSN: [id number] Arrival date & time: 12/12/21  1303      History   Chief Complaint Chief Complaint  Patient presents with   Rectal Pain    HPI Sean Ingram is a 31 y.o. male.   HPI Here for pain in his rectum that began this morning.  He feels it swollen there.  He has been constipated in the last few months.  Does have a history of hemorrhoid and a being constipated.  No fever or chills or vomiting   History reviewed. No pertinent past medical history.  Patient Active Problem List   Diagnosis Date Noted   Appendicitis, acute 09/13/2012    Past Surgical History:  Procedure Laterality Date   APPENDECTOMY  09/12/2012   KNEE ARTHROSCOPY W/ PARTIAL MEDIAL MENISCECTOMY Left 09/23/2021   LAPAROSCOPIC APPENDECTOMY N/A 09/13/2012   Procedure: APPENDECTOMY LAPAROSCOPIC;  Surgeon: Liz MaladyBurke E Thompson, MD;  Location: [facility];  Service: General;  Laterality: N/A;       Home Medications    Prior to Admission medications   Medication Sig Start Date End Date Taking? Authorizing Provider  hydrocortisone (ANUSOL-HC) 2.5 % rectal cream Place 1 Application rectally 2 (two) times daily. Until improved 12/12/21  Yes Khamani Daniely, Janace ArisPamela K, MD  ibuprofen (ADVIL) 800 MG tablet Take 1 tablet (800 mg total) by mouth every 8 (eight) hours as needed (pain). 12/12/21  Yes Zenia ResidesBanister, Ople Girgis K, MD    Family History Family History  Problem Relation Age of Onset   Diabetes Mother    Stomach cancer Neg Hx    Colon cancer Neg Hx     Social History Social History   Tobacco Use   Smoking status: Former    Packs/day: 0.50    Types: Cigarettes    Quit date: 02/25/2017    Years since quitting: 4.7   Smokeless tobacco: Never   Tobacco comments:    Pt states he quit smoking in 2019   Vaping Use   Vaping Use: Never used  Substance Use Topics   Alcohol use: Never   Drug use: No     Allergies   Patient has no known allergies.   Review of Systems Review  of Systems   Physical Exam Triage Vital Signs ED Triage Vitals  Enc Vitals Group     BP 12/12/21 1423 107/66     Pulse Rate 12/12/21 1423 82     Resp 12/12/21 1423 18     Temp 12/12/21 1423 98.9 F (37.2 C)     Temp src --      SpO2 12/12/21 1423 98 %     Weight --      Height --      Head Circumference --      Peak Flow --      Pain Score 12/12/21 1422 10     Pain Loc --      Pain Edu? --      Excl. in GC? --    No data found.  Updated Vital Signs BP 107/66   Pulse 82   Temp 98.9 F (37.2 C)   Resp 18   SpO2 98%   Visual Acuity Right Eye Distance:   Left Eye Distance:   Bilateral Distance:    Right Eye Near:   Left Eye Near:    Bilateral Near:     Physical Exam Vitals reviewed.  Constitutional:      General: He is not in acute distress.  Appearance: He is not ill-appearing, toxic-appearing or diaphoretic.  HENT:     Mouth/Throat:     Mouth: Mucous membranes are moist.  Eyes:     Extraocular Movements: Extraocular movements intact.     Pupils: Pupils are equal, round, and reactive to light.  Cardiovascular:     Rate and Rhythm: Normal rate and regular rhythm.     Heart sounds: No murmur heard. Pulmonary:     Effort: Pulmonary effort is normal.     Breath sounds: Normal breath sounds.  Genitourinary:    Comments: There is an external hemorrhoid in the left anterior aspect of the rectum.  It is soft and tender.  It measures about 1.5 cm in diameter Musculoskeletal:     Cervical back: Neck supple.  Lymphadenopathy:     Cervical: No cervical adenopathy.  Skin:    Coloration: Skin is not jaundiced or pale.  Neurological:     General: No focal deficit present.     Mental Status: He is alert and oriented to person, place, and time.  Psychiatric:        Behavior: Behavior normal.      UC Treatments / Results  Labs (all labs ordered are listed, but only abnormal results are displayed) Labs Reviewed - No data to display  EKG   Radiology No  results found.  Procedures Procedures (including critical care time)  Medications Ordered in UC Medications  ketorolac (TORADOL) 30 MG/ML injection 30 mg (has no administration in time range)    Initial Impression / Assessment and Plan / UC Course  I have reviewed the triage vital signs and the nursing notes.  Pertinent labs & imaging results that were available during my care of the patient were reviewed by me and considered in my medical decision making (see chart for details).        I have discussed with the patient that preventing the constipation would most likely make the recurrence of this hemorrhoid less likely.  He wanted to know if surgery was an option.  I then told him that he needs to try not being constipated first and then if those kind of measures are not helping then his doctors may refer him for surgery.  Toradol and ibuprofen are sent for pain, and Anusol cream was sent in to apply to the hemorrhoid.  He is also given recommendations for over-the-counter medication that should keep him from being obstipated. Final Clinical Impressions(s) / UC Diagnoses   Final diagnoses:  External hemorrhoid     Discharge Instructions      You have been given a shot of Toradol 30 mg today.  Take ibuprofen 800 mg--1 tab every 8 hours as needed for pain.  Put hydrocortisone rectal cream on the hemorrhoid 2 times a day until its better   Putting an ice cube on the hemorrhoid can help how it feels  Please get MiraLAX over-the-counter and begin using it daily according to the package instructions.  If you can keep yourself from being constipated, it most likely will make your hemorrhoid not recur so often  Also eat more fruits and vegetables and drink plenty of water     ED Prescriptions     Medication Sig Dispense Auth. Provider   ibuprofen (ADVIL) 800 MG tablet Take 1 tablet (800 mg total) by mouth every 8 (eight) hours as needed (pain). 21 tablet Lundon Rosier, Gwenlyn Perking, MD   hydrocortisone (ANUSOL-HC) 2.5 % rectal cream Place 1 Application rectally 2 (two) times  daily. Until improved 30 g Windy Carina Gwenlyn Perking, MD      PDMP not reviewed this encounter.   Sean Henle, MD 12/12/21 1501

## 2022-01-17 ENCOUNTER — Ambulatory Visit: Payer: Medicaid Other | Admitting: Physical Therapy

## 2022-01-25 ENCOUNTER — Other Ambulatory Visit: Payer: Self-pay

## 2022-01-25 ENCOUNTER — Ambulatory Visit: Payer: Medicaid Other | Attending: Orthopedic Surgery | Admitting: Physical Therapy

## 2022-01-25 ENCOUNTER — Encounter: Payer: Self-pay | Admitting: Physical Therapy

## 2022-01-25 DIAGNOSIS — M6281 Muscle weakness (generalized): Secondary | ICD-10-CM | POA: Insufficient documentation

## 2022-01-25 DIAGNOSIS — M542 Cervicalgia: Secondary | ICD-10-CM | POA: Diagnosis present
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
## 2022-01-27 NOTE — Therapy (Signed)
OUTPATIENT PHYSICAL THERAPY TREATMENT NOTE   Patient Name: Sean Ingram MRN: [id number] DOB:09-05-1990, 31 y.o., male Today's Date: 01/31/2022  PCP: Sean Ingram  REFERRING PROVIDER: Romero Belling, MD   END OF SESSION:   PT End of Session - 01/31/22 1124     Visit Number 2    Date for PT Re-Evaluation 03/22/22    Authorization Type [employer]    Authorization - Visit Number 1    Authorization - Number of Visits 5    PT Start Time 1124   Pt arrived 9 mins late   PT Stop Time 1202    PT Time Calculation (min) 38 min    Activity Tolerance Patient tolerated treatment well    Behavior During Therapy [facility] for tasks assessed/performed             History reviewed. No pertinent past medical history. Past Surgical History:  Procedure Laterality Date   APPENDECTOMY  09/12/2012   KNEE ARTHROSCOPY W/ PARTIAL MEDIAL MENISCECTOMY Left 09/23/2021   LAPAROSCOPIC APPENDECTOMY N/A 09/13/2012   Procedure: APPENDECTOMY LAPAROSCOPIC;  Surgeon: Sean Malady, MD;  Location: [facility];  Service: Sean [facility];  Laterality: N/A;   Patient Active Problem List   Diagnosis Date Noted   Appendicitis, acute 09/13/2012    REFERRING DIAG: neck pain   THERAPY DIAG:  Cervicalgia  Muscle weakness  Rationale for Evaluation and Treatment Rehabilitation  PERTINENT HISTORY: None  PRECAUTIONS: None  SUBJECTIVE:                                                                                                                                                                                      SUBJECTIVE STATEMENT:  Patient reports continued pain in middle of neck and upper back.    PAIN:  Are you having pain? Yes Pain location: mid cervical to mid thoracic pain NPRS scale:  6/10 to 8/10 Aggravating factors: driving, forward flexion, standing Relieving factors: cervical ext with relaxation Pain description: aching Stage: Chronic Stability: staying the same    OBJECTIVE:  (objective measures completed at initial evaluation unless otherwise dated)   DIAGNOSTIC FINDINGS:  Pt states he had an MRI but does not remember the results   [facility] OBSERVATION:          Slight forward head, rounded shoulders                               SENSATION:          Light touch: Appears intact           PALPATION: TTP mid cervical paraspinals to  mid thoracic paraspinals, TTP bil UT   Cervical ROM   ROM ROM  01/25/2022  Flexion 50*  Extension 30*  Right lateral flexion 40*  Left lateral flexion 40*  Right rotation WNL  Left rotation WNL  Flexion rotation (normal is 30 degrees)    Flexion rotation (normal is 30 degrees)      (Blank rows = not tested, N = WNL, * = concordant pain)   UPPER EXTREMITY MMT:   MMT Right 01/25/2022 Left 01/25/2022  Shoulder flexion 4* 4*  Shoulder abduction (C5) 4+* 4+*  Shoulder ER 4 4  Shoulder IR      Middle trapezius 4 3+  Lower trapezius 3+ 3+  Shoulder extension      Grip strength      Cervical flexion (C1,C2)      Cervical S/B (C3)      Shoulder shrug (C4)      Elbow flexion (C6)      Elbow ext (C7)      Thumb ext (C8)      Finger abd (T1)      DNF endurance 35''      (Blank rows = not tested, score listed is out of 5 possible points.  N = WNL, D = diminished, C = clear for gross weakness with myotome testing, * = concordant pain with testing)       JOINT MOBILITY TESTING:  Hypomobile mid tx spine   PATIENT SURVEYS:  NDI 18/50              TODAY'S TREATMENT:  Reviewing HEP that was given by previous PT in early 2023   Manual Therapy - Grade V prone mid thoracic manip 3x with cavitation x1                PATIENT EDUCATION:  POC, diagnosis, prognosis, HEP, and outcome measures.  Pt educated via explanation, demonstration, and handout (HEP).  Pt confirms understanding verbally.             HOME EXERCISE PROGRAM:   Access Code: [email or web address] URL: [email or web address] Date:  01/25/2022 Prepared by: Sean Ingram   Exercises - Seated Passive Cervical Retraction  - 1 x daily - 7 x weekly - 3 sets - 10 reps - Seated Assisted Cervical Rotation with Towel  - 3 x daily - 7 x weekly - 2 sets - 15 reps   ASTERISK SIGNS     Asterisk Signs Eval (01/25/2022)            Pain with cervical flexion As high as 8/10            Mid trap MMT R 4, L 3+            Low trap MMT 3+                                            TREATMENT 12/18: Therapeutic Exercise: - UBE level 2 x3' (1.5' fwd/bwd) - High/low rows 20# 2x10 each - Lat pull down 20# 2x10 - Standing horizontal abduction RTB 2x10 - Standing diagonals RTB x10 - Seated upper trap stretch 2x30" BIL - Seated levator scap stretch 2x30" BIL Manual: - STM BIL upper traps - Positional release BIL upper traps - SO release    ASSESSMENT:   CLINICAL IMPRESSION: Patient presents to PT with continued reports of neck and upper back pain and  reports HEP compliance. Session today focused on periscapular strengthening as well as stretching to upper traps and manual techniques to decrease tension. Patient was able to tolerate all prescribed exercises with no adverse effects. Patient continues to benefit from skilled PT services and should be progressed as able to improve functional independence.     OBJECTIVE IMPAIRMENTS: Pain, periscapular strength   ACTIVITY LIMITATIONS: driving, sitting, standing, work   PERSONAL FACTORS: See medical history and pertinent history     REHAB POTENTIAL: Good   CLINICAL DECISION MAKING: Stable/uncomplicated   EVALUATION COMPLEXITY: Low     GOALS:     SHORT TERM GOALS: Target date: 02/22/2022   [name] will be >75% HEP compliant to improve carryover between sessions and facilitate independent management of condition   Evaluation (01/25/2022): ongoing Goal status: INITIAL     LONG TERM GOALS: Target date: 03/22/2022   [name] will show a >/= 10 pt improvement in their NDI  score (MCID 7.5 pts) as a proxy for functional improvement   Evaluation/Baseline (01/25/2022): 18/50 pts Goal status: INITIAL     2.  [name] will self report >/= 50% decrease in pain from evaluation    Evaluation/Baseline (01/25/2022): 8/10 max pain Goal status: INITIAL     3.  [name] will improve the following MMTs to >/= 4+/5 to show improvement in strength:      Evaluation/Baseline (01/25/2022): see chart in note   UPPER EXTREMITY MMT:   MMT Right 01/25/2022 Left 01/25/2022  Shoulder flexion 4* 4*  Shoulder abduction (C5) 4+* 4+*  Shoulder ER 4 4  Shoulder IR      Middle trapezius 4 3+  Lower trapezius 3+ 3+  Shoulder extension      Grip strength      Cervical flexion (C1,C2)      Cervical S/B (C3)      Shoulder shrug (C4)      Elbow flexion (C6)      Elbow ext (C7)      Thumb ext (C8)      Finger abd (T1)      DNF endurance 30''      (Blank rows = not tested, score listed is out of 5 possible points.  N = WNL, D = diminished, C = clear for gross weakness with myotome testing, * = concordant pain with testing)   Goal status: INITIAL       PLAN: PT FREQUENCY: 1-2x/week   PT DURATION: 8 weeks (Ending 03/22/2022)   PLANNED INTERVENTIONS: Therapeutic exercises, Aquatic therapy, Therapeutic activity, Neuro Muscular re-education, Gait training, Patient/Family education, Joint mobilization, Dry Needling, Electrical stimulation, Spinal mobilization and/Ingram manipulation, Moist heat, Taping, Vasopneumatic device, Ionotophoresis 4mg /ml Dexamethasone, and Manual therapy   PLAN FOR NEXT SESSION: TDN, manual therapy, generalized strengthening    , PTA 01/31/2022, 12:03 PM

## 2022-01-31 ENCOUNTER — Ambulatory Visit: Payer: Medicaid Other

## 2022-01-31 DIAGNOSIS — M542 Cervicalgia: Secondary | ICD-10-CM

## 2022-01-31 DIAGNOSIS — M6281 Muscle weakness (generalized): Secondary | ICD-10-CM

## 2022-02-03 ENCOUNTER — Encounter: Payer: Self-pay | Admitting: Physical Therapy

## 2022-02-03 ENCOUNTER — Ambulatory Visit: Payer: Medicaid Other | Admitting: Physical Therapy

## 2022-02-03 DIAGNOSIS — M6281 Muscle weakness (generalized): Secondary | ICD-10-CM

## 2022-02-03 DIAGNOSIS — M542 Cervicalgia: Secondary | ICD-10-CM

## 2022-02-03 NOTE — Therapy (Signed)
OUTPATIENT PHYSICAL THERAPY TREATMENT NOTE   Patient Name: Sean Ingram MRN: [id number] DOB:10/18/1990, 31 y.o., male Today's Date: 02/03/2022  PCP: Patient, No Pcp Per  REFERRING PROVIDER: Romero Belling, MD   END OF SESSION:   PT End of Session - 02/03/22 0704     Visit Number 3    Number of Visits 6    Date for PT Re-Evaluation 03/22/22    Authorization Type [employer]    Authorization Time Period Approved 5 visits 01/26/22-03/26/22    Authorization - Number of Visits --    PT Start Time 0700    PT Stop Time 0740    PT Time Calculation (min) 40 min    Activity Tolerance Patient tolerated treatment well    Behavior During Therapy [facility] for tasks assessed/performed             History reviewed. No pertinent past medical history. Past Surgical History:  Procedure Laterality Date   APPENDECTOMY  09/12/2012   KNEE ARTHROSCOPY W/ PARTIAL MEDIAL MENISCECTOMY Left 09/23/2021   LAPAROSCOPIC APPENDECTOMY N/A 09/13/2012   Procedure: APPENDECTOMY LAPAROSCOPIC;  Surgeon: Sean Malady, MD;  Location: [facility];  Service: General;  Laterality: N/A;   Patient Active Problem List   Diagnosis Date Noted   Appendicitis, acute 09/13/2012    REFERRING DIAG: neck pain   THERAPY DIAG:  Cervicalgia  Muscle weakness  Rationale for Evaluation and Treatment Rehabilitation  PERTINENT HISTORY: None  PRECAUTIONS: None  SUBJECTIVE:                                                                                                                                                                                      SUBJECTIVE STATEMENT:  Pt reports some improvement in pain, but continued discomfort. 5/10 pain currently.     PAIN:  Are you having pain? Yes Pain location: mid cervical to mid thoracic pain NPRS scale:  5/10 to 8/10 Aggravating factors: driving, forward flexion, standing Relieving factors: cervical ext with relaxation Pain description: aching Stage:  Chronic Stability: staying the same    OBJECTIVE: (objective measures completed at initial evaluation unless otherwise dated)   DIAGNOSTIC FINDINGS:  Pt states he had an MRI but does not remember the results   GENERAL OBSERVATION:          Slight forward head, rounded shoulders                               SENSATION:          Light touch: Appears intact           PALPATION:  TTP mid cervical paraspinals to mid thoracic paraspinals, TTP bil UT   Cervical ROM   ROM ROM  01/25/2022  Flexion 50*  Extension 30*  Right lateral flexion 40*  Left lateral flexion 40*  Right rotation WNL  Left rotation WNL  Flexion rotation (normal is 30 degrees)    Flexion rotation (normal is 30 degrees)      (Blank rows = not tested, N = WNL, * = concordant pain)   UPPER EXTREMITY MMT:   MMT Right 01/25/2022 Left 01/25/2022  Shoulder flexion 4* 4*  Shoulder abduction (C5) 4+* 4+*  Shoulder ER 4 4  Shoulder IR      Middle trapezius 4 3+  Lower trapezius 3+ 3+  Shoulder extension      Grip strength      Cervical flexion (C1,C2)      Cervical S/B (C3)      Shoulder shrug (C4)      Elbow flexion (C6)      Elbow ext (C7)      Thumb ext (C8)      Finger abd (T1)      DNF endurance 35''      (Blank rows = not tested, score listed is out of 5 possible points.  N = WNL, D = diminished, C = clear for gross weakness with myotome testing, * = concordant pain with testing)       JOINT MOBILITY TESTING:  Hypomobile mid tx spine   PATIENT SURVEYS:  NDI 18/50              TODAY'S TREATMENT:  Reviewing HEP that was given by previous PT in early 2023   Manual Therapy - Grade V prone mid thoracic manip 3x with cavitation x1                PATIENT EDUCATION:  POC, diagnosis, prognosis, HEP, and outcome measures.  Pt educated via explanation, demonstration, and handout (HEP).  Pt confirms understanding verbally.             HOME EXERCISE PROGRAM:   Access Code: [email or web address] URL:  [email or web address] Date: 01/25/2022 Prepared by: Sean Ingram   Exercises - Seated Passive Cervical Retraction  - 1 x daily - 7 x weekly - 3 sets - 10 reps - Seated Assisted Cervical Rotation with Towel  - 3 x daily - 7 x weekly - 2 sets - 15 reps   ASTERISK SIGNS     Asterisk Signs Eval (01/25/2022)            Pain with cervical flexion As high as 8/10            Mid trap MMT R 4, L 3+            Low trap MMT 3+                                            TREATMENT 12/21: Therapeutic Exercise: - UBE level 2 x3' (2.5' fwd/bwd) - Seated upper trap stretch 2x30" BIL - Seated levator scap stretch 2x30" BIL - ball roll on wall with thoracic ext 10x  Manual therapy: Skilled palpation to identify trigger points for TDN STM bil thoracic paraspinals GV thoracic manipulation with cavitation x2  Trigger Point Dry-Needling  Treatment instructions: Expect mild to moderate muscle soreness. S/S of pneumothorax if dry needled over a lung field, and to  seek immediate medical attention should they occur. Patient verbalized understanding of these instructions and education.  Patient Consent Given: Yes Education handout provided: No Muscles treated: thoracic paraspinals T4 - T9 Electrical stimulation performed: Yes Parameters: 10 min low frequency - milli amps - low intensity Treatment response/outcome: pain reduction    ASSESSMENT:   CLINICAL IMPRESSION: Sean Ingram tolerated session well with no adverse reaction.  We concentrated on pain reduction today via TDN and manual.  PT reports significant pain reduction following therapy to 0/10 pain.    OBJECTIVE IMPAIRMENTS: Pain, periscapular strength   ACTIVITY LIMITATIONS: driving, sitting, standing, work   PERSONAL FACTORS: See medical history and pertinent history     REHAB POTENTIAL: Good   CLINICAL DECISION MAKING: Stable/uncomplicated   EVALUATION COMPLEXITY: Low     GOALS:     SHORT TERM GOALS: Target  date: 02/22/2022   Sean Ingram will be >75% HEP compliant to improve carryover between sessions and facilitate independent management of condition   Evaluation (01/25/2022): ongoing Goal status: INITIAL     LONG TERM GOALS: Target date: 03/22/2022   Sean Ingram will show a >/= 10 pt improvement in their NDI score (MCID 7.5 pts) as a proxy for functional improvement   Evaluation/Baseline (01/25/2022): 18/50 pts Goal status: INITIAL     2.  Jeryn will self report >/= 50% decrease in pain from evaluation    Evaluation/Baseline (01/25/2022): 8/10 max pain Goal status: INITIAL     3.  Othell will improve the following MMTs to >/= 4+/5 to show improvement in strength:      Evaluation/Baseline (01/25/2022): see chart in note   UPPER EXTREMITY MMT:   MMT Right 01/25/2022 Left 01/25/2022  Shoulder flexion 4* 4*  Shoulder abduction (C5) 4+* 4+*  Shoulder ER 4 4  Shoulder IR      Middle trapezius 4 3+  Lower trapezius 3+ 3+  Shoulder extension      Grip strength      Cervical flexion (C1,C2)      Cervical S/B (C3)      Shoulder shrug (C4)      Elbow flexion (C6)      Elbow ext (C7)      Thumb ext (C8)      Finger abd (T1)      DNF endurance 30''      (Blank rows = not tested, score listed is out of 5 possible points.  N = WNL, D = diminished, C = clear for gross weakness with myotome testing, * = concordant pain with testing)   Goal status: INITIAL       PLAN: PT FREQUENCY: 1-2x/week   PT DURATION: 8 weeks (Ending 03/22/2022)   PLANNED INTERVENTIONS: Therapeutic exercises, Aquatic therapy, Therapeutic activity, Neuro Muscular re-education, Gait training, Patient/Family education, Joint mobilization, Dry Needling, Electrical stimulation, Spinal mobilization and/or manipulation, Moist heat, Taping, Vasopneumatic device, Ionotophoresis 4mg /ml Dexamethasone, and Manual therapy   PLAN FOR NEXT SESSION: TDN, manual therapy, generalized strengthening    ,  PT 02/03/2022, 7:42 AM

## 2022-02-09 NOTE — Therapy (Incomplete)
OUTPATIENT PHYSICAL THERAPY TREATMENT NOTE   Patient Name: Sean Ingram MRN: [id number] DOB:1990/05/02, 31 y.o., male Today's Date: 02/09/2022  PCP: Patient, No Pcp Per  REFERRING PROVIDER: Romero Belling, MD   END OF SESSION:     No past medical history on file. Past Surgical History:  Procedure Laterality Date   APPENDECTOMY  09/12/2012   KNEE ARTHROSCOPY W/ PARTIAL MEDIAL MENISCECTOMY Left 09/23/2021   LAPAROSCOPIC APPENDECTOMY N/A 09/13/2012   Procedure: APPENDECTOMY LAPAROSCOPIC;  Surgeon: Liz Malady, MD;  Location: [facility];  Service: General;  Laterality: N/A;   Patient Active Problem List   Diagnosis Date Noted   Appendicitis, acute 09/13/2012    REFERRING DIAG: neck pain   THERAPY DIAG:  No diagnosis found.  Rationale for Evaluation and Treatment Rehabilitation  PERTINENT HISTORY: None  PRECAUTIONS: None  SUBJECTIVE:                                                                                                                                                                                      SUBJECTIVE STATEMENT: *** Pt reports some improvement in pain, but continued discomfort. 5/10 pain currently.    PAIN:  Are you having pain? Yes Pain location: mid cervical to mid thoracic pain NPRS scale:  ***5/10 to 8/10 Aggravating factors: driving, forward flexion, standing Relieving factors: cervical ext with relaxation Pain description: aching Stage: Chronic Stability: staying the same    OBJECTIVE: (objective measures completed at initial evaluation unless otherwise dated)   DIAGNOSTIC FINDINGS:  Pt states he had an MRI but does not remember the results   GENERAL OBSERVATION:          Slight forward head, rounded shoulders                               SENSATION:          Light touch: Appears intact           PALPATION: TTP mid cervical paraspinals to mid thoracic paraspinals, TTP bil UT   Cervical ROM   ROM ROM  01/25/2022   Flexion 50*  Extension 30*  Right lateral flexion 40*  Left lateral flexion 40*  Right rotation WNL  Left rotation WNL  Flexion rotation (normal is 30 degrees)    Flexion rotation (normal is 30 degrees)      (Blank rows = not tested, N = WNL, * = concordant pain)   UPPER EXTREMITY MMT:   MMT Right 01/25/2022 Left 01/25/2022  Shoulder flexion 4* 4*  Shoulder abduction (C5) 4+* 4+*  Shoulder ER 4 4  Shoulder IR  Middle trapezius 4 3+  Lower trapezius 3+ 3+  Shoulder extension      Grip strength      Cervical flexion (C1,C2)      Cervical S/B (C3)      Shoulder shrug (C4)      Elbow flexion (C6)      Elbow ext (C7)      Thumb ext (C8)      Finger abd (T1)      DNF endurance 35''      (Blank rows = not tested, score listed is out of 5 possible points.  N = WNL, D = diminished, C = clear for gross weakness with myotome testing, * = concordant pain with testing)       JOINT MOBILITY TESTING:  Hypomobile mid tx spine   PATIENT SURVEYS:  NDI 18/50              TODAY'S TREATMENT:  Reviewing HEP that was given by previous PT in early 2023   Manual Therapy - Grade V prone mid thoracic manip 3x with cavitation x1                PATIENT EDUCATION:  POC, diagnosis, prognosis, HEP, and outcome measures.  Pt educated via explanation, demonstration, and handout (HEP).  Pt confirms understanding verbally.             HOME EXERCISE PROGRAM:   Access Code: [email or web address] URL: [email or web address] Date: 01/25/2022 Prepared by: Alphonzo Severance   Exercises - Seated Passive Cervical Retraction  - 1 x daily - 7 x weekly - 3 sets - 10 reps - Seated Assisted Cervical Rotation with Towel  - 3 x daily - 7 x weekly - 2 sets - 15 reps   ASTERISK SIGNS     Asterisk Signs Eval (01/25/2022)            Pain with cervical flexion As high as 8/10            Mid trap MMT R 4, L 3+            Low trap MMT 3+                                            TREATMENT  12/28: - UBE level 2 x 5' (2.5' fwd/bwd) - High/low rows 20# 2x10 each - Lat pull down 20# 2x10 - Standing horizontal abduction RTB 2x10 - Standing diagonals RTB x10 - Seated upper trap stretch 2x30" BIL - Seated levator scap stretch 2x30" BIL - ball roll on wall with thoracic ext 10x - chin tucks into ball on wall 3" hold 2x10 Manual: - STM BIL upper traps - Positional release BIL upper traps - SO release  TREATMENT 12/21: Therapeutic Exercise: - UBE level 2 x3' (2.5' fwd/bwd) - Seated upper trap stretch 2x30" BIL - Seated levator scap stretch 2x30" BIL - ball roll on wall with thoracic ext 10x  Manual therapy: Skilled palpation to identify trigger points for TDN STM bil thoracic paraspinals GV thoracic manipulation with cavitation x2  Trigger Point Dry-Needling  Treatment instructions: Expect mild to moderate muscle soreness. S/S of pneumothorax if dry needled over a lung field, and to seek immediate medical attention should they occur. Patient verbalized understanding of these instructions and education.  Patient Consent Given: Yes Education handout provided: No Muscles treated: thoracic paraspinals T4 - T9 Electrical  stimulation performed: Yes Parameters: 10 min low frequency - milli amps - low intensity Treatment response/outcome: pain reduction    ASSESSMENT:   CLINICAL IMPRESSION: *** Patient presents to PT   Zebastian tolerated session well with no adverse reaction.  We concentrated on pain reduction today via TDN and manual.  PT reports significant pain reduction following therapy to 0/10 pain.    OBJECTIVE IMPAIRMENTS: Pain, periscapular strength   ACTIVITY LIMITATIONS: driving, sitting, standing, work   PERSONAL FACTORS: See medical history and pertinent history     REHAB POTENTIAL: Good   CLINICAL DECISION MAKING: Stable/uncomplicated   EVALUATION COMPLEXITY: Low     GOALS:     SHORT TERM GOALS: Target date: 02/22/2022   Mehran will be >75% HEP  compliant to improve carryover between sessions and facilitate independent management of condition   Evaluation (01/25/2022): ongoing Goal status: INITIAL     LONG TERM GOALS: Target date: 03/22/2022   Demontez will show a >/= 10 pt improvement in their NDI score (MCID 7.5 pts) as a proxy for functional improvement   Evaluation/Baseline (01/25/2022): 18/50 pts Goal status: INITIAL     2.  Jospeh will self report >/= 50% decrease in pain from evaluation    Evaluation/Baseline (01/25/2022): 8/10 max pain Goal status: INITIAL     3.  Sinai will improve the following MMTs to >/= 4+/5 to show improvement in strength:      Evaluation/Baseline (01/25/2022): see chart in note   UPPER EXTREMITY MMT:   MMT Right 01/25/2022 Left 01/25/2022  Shoulder flexion 4* 4*  Shoulder abduction (C5) 4+* 4+*  Shoulder ER 4 4  Shoulder IR      Middle trapezius 4 3+  Lower trapezius 3+ 3+  Shoulder extension      Grip strength      Cervical flexion (C1,C2)      Cervical S/B (C3)      Shoulder shrug (C4)      Elbow flexion (C6)      Elbow ext (C7)      Thumb ext (C8)      Finger abd (T1)      DNF endurance 30''      (Blank rows = not tested, score listed is out of 5 possible points.  N = WNL, D = diminished, C = clear for gross weakness with myotome testing, * = concordant pain with testing)   Goal status: INITIAL       PLAN: PT FREQUENCY: 1-2x/week   PT DURATION: 8 weeks (Ending 03/22/2022)   PLANNED INTERVENTIONS: Therapeutic exercises, Aquatic therapy, Therapeutic activity, Neuro Muscular re-education, Gait training, Patient/Family education, Joint mobilization, Dry Needling, Electrical stimulation, Spinal mobilization and/or manipulation, Moist heat, Taping, Vasopneumatic device, Ionotophoresis 4mg /ml Dexamethasone, and Manual therapy   PLAN FOR NEXT SESSION: TDN, manual therapy, generalized strengthening    , PTA 02/09/2022, 11:48 AM

## 2022-02-10 ENCOUNTER — Telehealth: Payer: Self-pay

## 2022-02-10 ENCOUNTER — Ambulatory Visit: Payer: Medicaid Other

## 2022-02-10 NOTE — Telephone Encounter (Signed)
LVM regarding missed appointment today. Confirmed next appointment time.  1st no-show  Sean Ingram, [name] 02/10/22 3:03 PM

## 2022-02-15 ENCOUNTER — Ambulatory Visit: Payer: Medicaid Other | Attending: Orthopedic Surgery | Admitting: Physical Therapy

## 2022-02-15 ENCOUNTER — Encounter: Payer: Self-pay | Admitting: Physical Therapy

## 2022-02-15 DIAGNOSIS — M6281 Muscle weakness (generalized): Secondary | ICD-10-CM | POA: Diagnosis present

## 2022-02-15 DIAGNOSIS — M25562 Pain in left knee: Secondary | ICD-10-CM | POA: Diagnosis present

## 2022-02-15 DIAGNOSIS — G8929 Other chronic pain: Secondary | ICD-10-CM | POA: Insufficient documentation

## 2022-02-15 DIAGNOSIS — R262 Difficulty in walking, not elsewhere classified: Secondary | ICD-10-CM | POA: Insufficient documentation

## 2022-02-15 DIAGNOSIS — R600 Localized edema: Secondary | ICD-10-CM | POA: Insufficient documentation

## 2022-02-15 DIAGNOSIS — M542 Cervicalgia: Secondary | ICD-10-CM | POA: Insufficient documentation

## 2022-02-15 NOTE — Therapy (Signed)
OUTPATIENT PHYSICAL THERAPY TREATMENT NOTE   Patient Name: Sean Ingram MRN: [id number] DOB:Oct 24, 1990, 32 y.o., male Today's Date: 02/15/2022  PCP: Patient, No Pcp Per  REFERRING PROVIDER: Laroy Apple, MD   END OF SESSION:   PT End of Session - 02/15/22 0914     Visit Number 4    Number of Visits 6    Date for PT Re-Evaluation 03/22/22    Authorization Type [employer]    Authorization Time Period Approved 5 visits 01/26/22-03/26/22    PT Start Time 0915    PT Stop Time 0956    PT Time Calculation (min) 41 min    Activity Tolerance Patient tolerated treatment well    Behavior During Therapy [facility] for tasks assessed/performed             History reviewed. No pertinent past medical history. Past Surgical History:  Procedure Laterality Date   APPENDECTOMY  09/12/2012   KNEE ARTHROSCOPY W/ PARTIAL MEDIAL MENISCECTOMY Left 09/23/2021   LAPAROSCOPIC APPENDECTOMY N/A 09/13/2012   Procedure: APPENDECTOMY LAPAROSCOPIC;  Surgeon: Sean Jarred, MD;  Location: [place];  Service: General;  Laterality: N/A;   Patient Active Problem List   Diagnosis Date Noted   Appendicitis, acute 09/13/2012    REFERRING DIAG: neck pain   THERAPY DIAG:  Cervicalgia  Muscle weakness  Rationale for Evaluation and Treatment Rehabilitation  PERTINENT HISTORY: None  PRECAUTIONS: None  SUBJECTIVE:                                                                                                                                                                                      SUBJECTIVE STATEMENT:  Pt reports that his back pain is up and down.  He reports the TDN reduced his pain significantly at the time, but then his pain increased the next day. 5/10 pain currently.     PAIN:  Are you having pain? Yes Pain location: mid cervical to mid thoracic pain NPRS scale:  5/10 to 8/10 Aggravating factors: driving, forward flexion, standing Relieving factors: cervical ext with  relaxation Pain description: aching Stage: Chronic Stability: staying the same    OBJECTIVE: (objective measures completed at initial evaluation unless otherwise dated)   DIAGNOSTIC FINDINGS:  Pt states he had an MRI but does not remember the results   GENERAL OBSERVATION:          Slight forward head, rounded shoulders                               SENSATION:          Light touch: Appears  intact           PALPATION: TTP mid cervical paraspinals to mid thoracic paraspinals, TTP bil UT   Cervical ROM   ROM ROM  01/25/2022  Flexion 50*  Extension 30*  Right lateral flexion 40*  Left lateral flexion 40*  Right rotation WNL  Left rotation WNL  Flexion rotation (normal is 30 degrees)    Flexion rotation (normal is 30 degrees)      (Blank rows = not tested, N = WNL, * = concordant pain)   UPPER EXTREMITY MMT:   MMT Right 01/25/2022 Left 01/25/2022  Shoulder flexion 4* 4*  Shoulder abduction (C5) 4+* 4+*  Shoulder ER 4 4  Shoulder IR      Middle trapezius 4 3+  Lower trapezius 3+ 3+  Shoulder extension      Grip strength      Cervical flexion (C1,C2)      Cervical S/B (C3)      Shoulder shrug (C4)      Elbow flexion (C6)      Elbow ext (C7)      Thumb ext (C8)      Finger abd (T1)      DNF endurance 35''      (Blank rows = not tested, score listed is out of 5 possible points.  N = WNL, D = diminished, C = clear for gross weakness with myotome testing, * = concordant pain with testing)       JOINT MOBILITY TESTING:  Hypomobile mid tx spine   PATIENT SURVEYS:  NDI 18/50                        PATIENT EDUCATION:  POC, diagnosis, prognosis, HEP, and outcome measures.  Pt educated via explanation, demonstration, and handout (HEP).  Pt confirms understanding verbally.             HOME EXERCISE PROGRAM:   Access Code: [email or web address] URL: [email or web address] Date: 02/15/2022 Prepared by: Sean Ingram  Exercises - Sidelying Thoracic  Rotation with Open Book  - 1 x daily - 7 x weekly - 1 sets - 20 reps - 2 hold - Seated Assisted Cervical Rotation with Towel  - 3 x daily - 7 x weekly - 2 sets - 15 reps - Prone Scapular Retraction Y  - 1 x daily - 7 x weekly - 3 sets - 10 reps - Prone Scapular Retraction Arms at Side  - 1 x daily - 7 x weekly - 3 sets - 10 reps   ASTERISK SIGNS     Asterisk Signs Eval (01/25/2022)  1/2          Pain with cervical flexion As high as 8/10  5/10          Mid trap MMT R 4, L 3+            Low trap MMT 3+                                            TREATMENT 1/2: Therapeutic Exercise: - UBE level 2 x3' (2.5' fwd/bwd) - S/L open book - 10x ea - thoracic ext over foam roller - 15x - low row - 20# - 2x10 - high row - 20# - 2x10 - lat pull down - 20# - 2x10 - prone ITYW - 2x10 ea - chin  tuck (difficult, continue to work on this)  Manual Therapy - prone thoracic G V manipulation - pone PA cervical spine - G III and IV - improved cervical ext following - chin tuck with OP   ASSESSMENT:   CLINICAL IMPRESSION: Sean Ingram tolerated session well with no adverse reaction.  Pt reports short term, but minimal medium/long term relief from TDN.  Given this we concentrated in periscapular exercise today.  He struggle significantly with motor control during chin tuck, we will continue to work on this.  Pt reports significant pain reduction following therapy.    OBJECTIVE IMPAIRMENTS: Pain, periscapular strength   ACTIVITY LIMITATIONS: driving, sitting, standing, work   PERSONAL FACTORS: See medical history and pertinent history     REHAB POTENTIAL: Good   CLINICAL DECISION MAKING: Stable/uncomplicated   EVALUATION COMPLEXITY: Low     GOALS:     SHORT TERM GOALS: Target date: 02/22/2022   Sean Ingram will be >75% HEP compliant to improve carryover between sessions and facilitate independent management of condition   Evaluation (01/25/2022): ongoing Goal status: INITIAL     LONG TERM  GOALS: Target date: 03/22/2022   Sean Ingram will show a >/= 10 pt improvement in their NDI score (MCID 7.5 pts) as a proxy for functional improvement   Evaluation/Baseline (01/25/2022): 18/50 pts Goal status: INITIAL     2.  Sean Ingram will self report >/= 50% decrease in pain from evaluation    Evaluation/Baseline (01/25/2022): 8/10 max pain Goal status: INITIAL     3.  Garrie will improve the following MMTs to >/= 4+/5 to show improvement in strength:      Evaluation/Baseline (01/25/2022): see chart in note   UPPER EXTREMITY MMT:   MMT Right 01/25/2022 Left 01/25/2022  Shoulder flexion 4* 4*  Shoulder abduction (C5) 4+* 4+*  Shoulder ER 4 4  Shoulder IR      Middle trapezius 4 3+  Lower trapezius 3+ 3+  Shoulder extension      Grip strength      Cervical flexion (C1,C2)      Cervical S/B (C3)      Shoulder shrug (C4)      Elbow flexion (C6)      Elbow ext (C7)      Thumb ext (C8)      Finger abd (T1)      DNF endurance 30''      (Blank rows = not tested, score listed is out of 5 possible points.  N = WNL, D = diminished, C = clear for gross weakness with myotome testing, * = concordant pain with testing)   Goal status: INITIAL       PLAN: PT FREQUENCY: 1-2x/week   PT DURATION: 8 weeks (Ending 03/22/2022)   PLANNED INTERVENTIONS: Therapeutic exercises, Aquatic therapy, Therapeutic activity, Neuro Muscular re-education, Gait training, Patient/Family education, Joint mobilization, Dry Needling, Electrical stimulation, Spinal mobilization and/or manipulation, Moist heat, Taping, Vasopneumatic device, Ionotophoresis 4mg /ml Dexamethasone, and Manual therapy   PLAN FOR NEXT SESSION: TDN, manual therapy, generalized strengthening    , PT 02/15/2022, 9:55 AM

## 2022-02-19 ENCOUNTER — Ambulatory Visit: Payer: Medicaid Other | Admitting: Physical Therapy

## 2022-03-03 ENCOUNTER — Encounter: Payer: Self-pay | Admitting: Physical Therapy

## 2022-03-03 ENCOUNTER — Ambulatory Visit: Payer: Medicaid Other | Admitting: Physical Therapy

## 2022-03-03 DIAGNOSIS — G8929 Other chronic pain: Secondary | ICD-10-CM

## 2022-03-03 DIAGNOSIS — R262 Difficulty in walking, not elsewhere classified: Secondary | ICD-10-CM

## 2022-03-03 DIAGNOSIS — M542 Cervicalgia: Secondary | ICD-10-CM | POA: Diagnosis not present

## 2022-03-03 DIAGNOSIS — R600 Localized edema: Secondary | ICD-10-CM

## 2022-03-03 DIAGNOSIS — M6281 Muscle weakness (generalized): Secondary | ICD-10-CM

## 2022-03-03 NOTE — Therapy (Signed)
OUTPATIENT PHYSICAL THERAPY TREATMENT NOTE   Patient Name: Sean Ingram MRN: [id number] DOB:12/21/1990, 32 y.o., male Today's Date: 03/03/2022  PCP: Patient, No Pcp Per  REFERRING PROVIDER: Laroy Apple, MD   END OF SESSION:   PT End of Session - 03/03/22 0832     Visit Number 5    Number of Visits 6    Date for PT Re-Evaluation 03/22/22    Authorization Type [employer]    Authorization Time Period Approved 5 visits 01/26/22-03/26/22    PT Start Time 0832    PT Stop Time 0912    PT Time Calculation (min) 40 min    Activity Tolerance Patient tolerated treatment well    Behavior During Therapy [facility] for tasks assessed/performed             History reviewed. No pertinent past medical history. Past Surgical History:  Procedure Laterality Date   APPENDECTOMY  09/12/2012   KNEE ARTHROSCOPY W/ PARTIAL MEDIAL MENISCECTOMY Left 09/23/2021   LAPAROSCOPIC APPENDECTOMY N/A 09/13/2012   Procedure: APPENDECTOMY LAPAROSCOPIC;  Surgeon: Zenovia Jarred, MD;  Location: [place];  Service: General;  Laterality: N/A;   Patient Active Problem List   Diagnosis Date Noted   Appendicitis, acute 09/13/2012    REFERRING DIAG: neck pain   THERAPY DIAG:  Cervicalgia  Muscle weakness  Chronic pain of left knee  Difficulty in walking, not elsewhere classified  Muscle weakness (generalized)  Localized edema  Rationale for Evaluation and Treatment Rehabilitation  PERTINENT HISTORY: None  PRECAUTIONS: None  SUBJECTIVE:                                                                                                                                                                                      SUBJECTIVE STATEMENT:  Pt states that the more he exercises the better his neck feels.  He rates his pain as 4/10   PAIN:  Are you having pain? Yes Pain location: mid cervical to mid thoracic pain NPRS scale:  4/10 to 8/10 Aggravating factors: driving, forward flexion,  standing Relieving factors: cervical ext with relaxation Pain description: aching Stage: Chronic Stability: staying the same    OBJECTIVE: (objective measures completed at initial evaluation unless otherwise dated)   DIAGNOSTIC FINDINGS:  Pt states he had an MRI but does not remember the results   GENERAL OBSERVATION:          Slight forward head, rounded shoulders                               SENSATION:  Light touch: Appears intact           PALPATION: TTP mid cervical paraspinals to mid thoracic paraspinals, TTP bil UT   Cervical ROM   ROM ROM  01/25/2022  Flexion 50*  Extension 30*  Right lateral flexion 40*  Left lateral flexion 40*  Right rotation WNL  Left rotation WNL  Flexion rotation (normal is 30 degrees)    Flexion rotation (normal is 30 degrees)      (Blank rows = not tested, N = WNL, * = concordant pain)   UPPER EXTREMITY MMT:   MMT Right 01/25/2022 Left 01/25/2022  Shoulder flexion 4* 4*  Shoulder abduction (C5) 4+* 4+*  Shoulder ER 4 4  Shoulder IR      Middle trapezius 4 3+  Lower trapezius 3+ 3+  Shoulder extension      Grip strength      Cervical flexion (C1,C2)      Cervical S/B (C3)      Shoulder shrug (C4)      Elbow flexion (C6)      Elbow ext (C7)      Thumb ext (C8)      Finger abd (T1)      DNF endurance 35''      (Blank rows = not tested, score listed is out of 5 possible points.  N = WNL, D = diminished, C = clear for gross weakness with myotome testing, * = concordant pain with testing)       JOINT MOBILITY TESTING:  Hypomobile mid tx spine   PATIENT SURVEYS:  NDI 18/50                        PATIENT EDUCATION:  POC, diagnosis, prognosis, HEP, and outcome measures.  Pt educated via explanation, demonstration, and handout (HEP).  Pt confirms understanding verbally.             HOME EXERCISE PROGRAM:   Access Code: [email or web address] URL: [email or web address] Date: 02/15/2022 Prepared by: Alphonzo Severance  Exercises - Sidelying Thoracic Rotation with Open Book  - 1 x daily - 7 x weekly - 1 sets - 20 reps - 2 hold - Seated Assisted Cervical Rotation with Towel  - 3 x daily - 7 x weekly - 2 sets - 15 reps - Prone Scapular Retraction Y  - 1 x daily - 7 x weekly - 3 sets - 10 reps - Prone Scapular Retraction Arms at Side  - 1 x daily - 7 x weekly - 3 sets - 10 reps   ASTERISK SIGNS     Asterisk Signs Eval (01/25/2022)  1/2  1/18        Pain with cervical flexion As high as 8/10  5/10 4/10         Mid trap MMT R 4, L 3+    R4, L 4        Low trap MMT 3+                                            TREATMENT 1/18: Therapeutic Exercise: - UBE level 2 x3' (2.5' fwd/bwd) - S/L open book - 10x ea - Cat cow - 20x - Chin tuck - 20x  - with rotation 10x ea - bird dog - 20x - 3'' hold - low row - 25# - 2x10 - high  row - 25# - 2x10 - lat pull down - 35# - 2x10 - prone ITYW - x20 ea  Manual Therapy - prone thoracic G V manipulation    ASSESSMENT:   CLINICAL IMPRESSION: Sean Ingram tolerated session well with no adverse reaction.  We concentrated on thoracic and cervical mobility combined with DNF and periscapular strengthening.  Chin tuck improved today.  Pt reports that he is able to maintain low levels of pain at home if he exercises.  Will plan on D/C next visit.    OBJECTIVE IMPAIRMENTS: Pain, periscapular strength   ACTIVITY LIMITATIONS: driving, sitting, standing, work   PERSONAL FACTORS: See medical history and pertinent history     REHAB POTENTIAL: Good   CLINICAL DECISION MAKING: Stable/uncomplicated   EVALUATION COMPLEXITY: Low     GOALS:     SHORT TERM GOALS: Target date: 02/22/2022   Sean Ingram will be >75% HEP compliant to improve carryover between sessions and facilitate independent management of condition   Evaluation (01/25/2022): ongoing Goal status: MET 1/18     LONG TERM GOALS: Target date: 03/22/2022   Sean Ingram will show a >/= 10 pt improvement in  their NDI score (MCID 7.5 pts) as a proxy for functional improvement   Evaluation/Baseline (01/25/2022): 18/50 pts Goal status: INITIAL     2.  Sean Ingram will self report >/= 50% decrease in pain from evaluation    Evaluation/Baseline (01/25/2022): 8/10 max pain Goal status: INITIAL     3.  Sean Ingram will improve the following MMTs to >/= 4+/5 to show improvement in strength:      Evaluation/Baseline (01/25/2022): see chart in note   UPPER EXTREMITY MMT:   MMT Right 01/25/2022 Left 01/25/2022  Shoulder flexion 4* 4*  Shoulder abduction (C5) 4+* 4+*  Shoulder ER 4 4  Shoulder IR      Middle trapezius 4 3+  Lower trapezius 3+ 3+  Shoulder extension      Grip strength      Cervical flexion (C1,C2)      Cervical S/B (C3)      Shoulder shrug (C4)      Elbow flexion (C6)      Elbow ext (C7)      Thumb ext (C8)      Finger abd (T1)      DNF endurance 30''      (Blank rows = not tested, score listed is out of 5 possible points.  N = WNL, D = diminished, C = clear for gross weakness with myotome testing, * = concordant pain with testing)   Goal status: INITIAL       PLAN: PT FREQUENCY: 1-2x/week   PT DURATION: 8 weeks (Ending 03/22/2022)   PLANNED INTERVENTIONS: Therapeutic exercises, Aquatic therapy, Therapeutic activity, Neuro Muscular re-education, Gait training, Patient/Family education, Joint mobilization, Dry Needling, Electrical stimulation, Spinal mobilization and/or manipulation, Moist heat, Taping, Vasopneumatic device, Ionotophoresis 4mg /ml Dexamethasone, and Manual therapy   PLAN FOR NEXT SESSION: TDN, manual therapy, generalized strengthening    Mathis Dad, PT 03/03/2022, 9:16 AM

## 2022-03-10 ENCOUNTER — Ambulatory Visit: Payer: Medicaid Other | Admitting: Physical Therapy

## 2022-03-10 ENCOUNTER — Encounter: Payer: Self-pay | Admitting: Physical Therapy

## 2022-03-10 DIAGNOSIS — M542 Cervicalgia: Secondary | ICD-10-CM | POA: Diagnosis not present

## 2022-03-10 DIAGNOSIS — R262 Difficulty in walking, not elsewhere classified: Secondary | ICD-10-CM

## 2022-03-10 DIAGNOSIS — M6281 Muscle weakness (generalized): Secondary | ICD-10-CM

## 2022-03-10 DIAGNOSIS — G8929 Other chronic pain: Secondary | ICD-10-CM

## 2022-03-10 DIAGNOSIS — R600 Localized edema: Secondary | ICD-10-CM

## 2022-03-10 NOTE — Therapy (Signed)
PHYSICAL THERAPY DISCHARGE SUMMARY  Visits from Start of Care: 6  Current functional level related to goals / functional outcomes: See assessment/goals   Remaining deficits: See assessment/goals   Education / Equipment: HEP and D/C plans  Patient agrees to discharge. Patient goals were partially met. Patient is being discharged due to being pleased with the current functional level.   Patient Name: Sean Ingram MRN: [id number] DOB:March 08, 1990, 32 y.o., male Today's Date: 03/10/2022  PCP: Patient, No Pcp Per  REFERRING PROVIDER: Romero Belling, MD   END OF SESSION:   PT End of Session - 03/10/22 0750     Visit Number 6    Number of Visits 6    Date for PT Re-Evaluation 03/22/22    Authorization Type [employer]    Authorization Time Period Approved 5 visits 01/26/22-03/26/22    PT Start Time 0748    PT Stop Time 0826    PT Time Calculation (min) 38 min    Activity Tolerance Patient tolerated treatment well    Behavior During Therapy [facility] for tasks assessed/performed             History reviewed. No pertinent past medical history. Past Surgical History:  Procedure Laterality Date   APPENDECTOMY  09/12/2012   KNEE ARTHROSCOPY W/ PARTIAL MEDIAL MENISCECTOMY Left 09/23/2021   LAPAROSCOPIC APPENDECTOMY N/A 09/13/2012   Procedure: APPENDECTOMY LAPAROSCOPIC;  Surgeon: Liz Malady, MD;  Location: [facility];  Service: General;  Laterality: N/A;   Patient Active Problem List   Diagnosis Date Noted   Appendicitis, acute 09/13/2012    REFERRING DIAG: neck pain   THERAPY DIAG:  Cervicalgia  Muscle weakness  Chronic pain of left knee  Difficulty in walking, not elsewhere classified  Muscle weakness (generalized)  Localized edema  Rationale for Evaluation and Treatment Rehabilitation  PERTINENT HISTORY: None  PRECAUTIONS: None  SUBJECTIVE:                                                                                                                                                                                       SUBJECTIVE STATEMENT:  Pt report that overall his neck is improving.  Sean Ingram still has some slight pain when Sean Ingram moves his neck.  Sean Ingram feels confident Sean Ingram can continue at home with HEP.   PAIN:  Are you having pain? Yes Pain location: mid cervical to mid thoracic pain NPRS scale:  2/10 to 4/10 Aggravating factors: driving, forward flexion, standing Relieving factors: cervical ext with relaxation Pain description: aching Stage: Chronic Stability: staying the same    OBJECTIVE: (objective measures completed at initial evaluation unless otherwise dated)   DIAGNOSTIC FINDINGS:  Pt  states Sean Ingram had an MRI but does not remember the results   GENERAL OBSERVATION:          Slight forward head, rounded shoulders                               SENSATION:          Light touch: Appears intact           PALPATION: TTP mid cervical paraspinals to mid thoracic paraspinals, TTP bil UT   Cervical ROM   ROM ROM  01/25/2022  Flexion 50*  Extension 30*  Right lateral flexion 40*  Left lateral flexion 40*  Right rotation WNL  Left rotation WNL  Flexion rotation (normal is 30 degrees)    Flexion rotation (normal is 30 degrees)      (Blank rows = not tested, N = WNL, * = concordant pain)   UPPER EXTREMITY MMT:   MMT Right 01/25/2022 Left 01/25/2022  Shoulder flexion 4* 4*  Shoulder abduction (C5) 4+* 4+*  Shoulder ER 4 4  Shoulder IR      Middle trapezius 4 3+  Lower trapezius 3+ 3+  Shoulder extension      Grip strength      Cervical flexion (C1,C2)      Cervical S/B (C3)      Shoulder shrug (C4)      Elbow flexion (C6)      Elbow ext (C7)      Thumb ext (C8)      Finger abd (T1)      DNF endurance 35''      (Blank rows = not tested, score listed is out of 5 possible points.  N = WNL, D = diminished, C = clear for gross weakness with myotome testing, * = concordant pain with testing)       JOINT MOBILITY  TESTING:  Hypomobile mid tx spine   PATIENT SURVEYS:  NDI 18/50                        PATIENT EDUCATION:  POC, diagnosis, prognosis, HEP, and outcome measures.  Pt educated via explanation, demonstration, and handout (HEP).  Pt confirms understanding verbally.             HOME EXERCISE PROGRAM:   Access Code: [email or web address] URL: [email or web address] Date: 03/10/2022 Prepared by: Shearon Balo  Exercises - Sidelying Thoracic Rotation with Open Book  - 1 x daily - 7 x weekly - 1 sets - 20 reps - 2 hold - Seated Assisted Cervical Rotation with Towel  - 3 x daily - 7 x weekly - 2 sets - 15 reps - Prone Scapular Retraction Y  - 1 x daily - 7 x weekly - 3 sets - 10 reps - Prone Scapular Retraction Arms at Side  - 1 x daily - 7 x weekly - 3 sets - 10 reps - Supine Cervical Retraction with Towel  - 2 x daily - 7 x weekly - 2 sets - 10 reps - 5 hold - Supine Deep Neck Flexor Training - Repetitions  - 1 x daily - 7 x weekly - 1 sets - 3 reps - 20 second hold   ASTERISK SIGNS     Asterisk Signs Eval (01/25/2022)  1/2  1/18        Pain with cervical flexion As high as 8/10  5/10 4/10  Mid trap MMT R 4, L 3+    R4, L 4        Low trap MMT 3+                                            TREATMENT 1/25: Therapeutic Exercise: - UBE level 2 x3' (2.5' fwd/bwd) - S/L open book - 10x ea - Cat cow - 20x - Chin tuck - 20x  - with rotation 10x ea  - with lift - bird dog - 20x - 3'' hold - low row - 25# - 2x10 - high row - 25# - 2x10 - lat pull down - 35# - 2x10  Manual Therapy - prone thoracic G V manipulation  Therapeutic Activity - collecting information for goals, checking progress, and reviewing with patient    ASSESSMENT:   CLINICAL IMPRESSION: Sean Ingram has progressed well with therapy.  Improved impairments include: periscapular strength, DNF endurance, pain.  Functional improvements include: improved ability to complete work and housework.   Progressions needed include: continued work at home with HEP.  Barriers to progress include: none.  Sean Ingram continues to have mild/moderate pain with neck movements but this is overall significantly improved.  Sean Ingram can continue with HEP and follow up PRN if Sean Ingram does not continue to make progress.  Please see GOALS section for progress on short term and long term goals established at evaluation.  I recommend D/C home with HEP; pt agrees with plan.    OBJECTIVE IMPAIRMENTS: Pain, periscapular strength   ACTIVITY LIMITATIONS: driving, sitting, standing, work   PERSONAL FACTORS: See medical history and pertinent history     REHAB POTENTIAL: Good   CLINICAL DECISION MAKING: Stable/uncomplicated   EVALUATION COMPLEXITY: Low     GOALS:     SHORT TERM GOALS: Target date: 02/22/2022   Fain will be >75% HEP compliant to improve carryover between sessions and facilitate independent management of condition   Evaluation (01/25/2022): ongoing Goal status: MET 1/18     LONG TERM GOALS: Target date: 03/22/2022   Sean Ingram will show a >/= 10 pt improvement in their NDI score (MCID 7.5 pts) as a proxy for functional improvement   Evaluation/Baseline (01/25/2022): 18/50 pts 1/25: Neck Disability Index: 10 / 50 = 20.0 % Goal status: partially met     2.  Demarus will self report >/= 50% decrease in pain from evaluation    Evaluation/Baseline (01/25/2022): 8/10 max pain 1/25: 4/10 Goal status: MET     3.  Tian will improve the following MMTs to >/= 4+/5 to show improvement in strength:      Evaluation/Baseline (01/25/2022): see chart in note   UPPER EXTREMITY MMT:   MMT Right 01/25/2022 Left 01/25/2022 R/L 1/25   Shoulder flexion 4* 4* 4+/4+   Shoulder abduction (C5) 4+* 4+*    Shoulder ER 4 4 4+/4+   Shoulder IR        Middle trapezius 4 3+ 4+/4+   Lower trapezius 3+ 3+ 4/4   Shoulder extension        Grip strength        Cervical flexion (C1,C2)        Cervical S/B (C3)         Shoulder shrug (C4)        Elbow flexion (C6)        Elbow ext (C7)  Thumb ext (C8)        Finger abd (T1)        DNF endurance 30''        (Blank rows = not tested, score listed is out of 5 possible points.  N = WNL, D = diminished, C = clear for gross weakness with myotome testing, * = concordant pain with testing)   Goal status: partially met       PLAN: PT FREQUENCY: 1-2x/week   PT DURATION: 8 weeks (Ending 03/22/2022)   PLANNED INTERVENTIONS: Therapeutic exercises, Aquatic therapy, Therapeutic activity, Neuro Muscular re-education, Gait training, Patient/Family education, Joint mobilization, Dry Needling, Electrical stimulation, Spinal mobilization and/or manipulation, Moist heat, Taping, Vasopneumatic device, Ionotophoresis 4mg /ml Dexamethasone, and Manual therapy   PLAN FOR NEXT SESSION: TDN, manual therapy, generalized strengthening    Mathis Dad, PT 03/10/2022, 8:27 AM

## 2022-06-27 ENCOUNTER — Encounter (HOSPITAL_COMMUNITY): Payer: Self-pay | Admitting: Emergency Medicine

## 2022-06-27 ENCOUNTER — Ambulatory Visit (HOSPITAL_COMMUNITY)
Admission: EM | Admit: 2022-06-27 | Discharge: 2022-06-27 | Disposition: A | Discharge status: Home / Self Care | Payer: Medicaid Other | Attending: Emergency Medicine | Admitting: Emergency Medicine

## 2022-06-27 DIAGNOSIS — J302 Other seasonal allergic rhinitis: Secondary | ICD-10-CM | POA: Diagnosis not present

## 2022-06-27 MED ORDER — FLUTICASONE PROPIONATE 50 MCG/ACT NA SUSP: 2.0000 | Freq: Every day | NASAL | 2 refills | Status: DC

## 2022-06-27 MED ORDER — OLOPATADINE HCL 0.1 % OP SOLN: 1.0000 [drp] | Freq: Two times a day (BID) | OPHTHALMIC | 2 refills | Status: DC

## 2022-06-27 MED ORDER — CETIRIZINE HCL 10 MG PO TABS: 10.0000 mg | ORAL_TABLET | Freq: Every day | ORAL | 2 refills | Status: DC

## 2022-06-27 NOTE — ED Triage Notes (Signed)
Pt reports itchy eyes and nasal congestion for over week. Tried eye drops that made worse.

## 2022-06-27 NOTE — ED Provider Notes (Signed)
[facility]    CSN: [id number] Arrival date & time: 06/27/22  4098      History   Chief Complaint Chief Complaint  Patient presents with   Itchy Eye   Nasal Congestion    HPI Sean Ingram is a 32 y.o. male.  Here with 1 week history of itchy watery eyes, runny nose, some nasal congestion, lots of sneezing No eye pain or discharge. Denies any sore throat, cough, fever, NVD  No known history of seasonal allergies but he has been outside a lot. Tried some over-the-counter eyedrops that made his eyes burn No other medications taken No sick contacts  History reviewed. No pertinent past medical history.  Patient Active Problem List   Diagnosis Date Noted   Appendicitis, acute 09/13/2012    Past Surgical History:  Procedure Laterality Date   APPENDECTOMY  09/12/2012   KNEE ARTHROSCOPY W/ PARTIAL MEDIAL MENISCECTOMY Left 09/23/2021   LAPAROSCOPIC APPENDECTOMY N/A 09/13/2012   Procedure: APPENDECTOMY LAPAROSCOPIC;  Surgeon: Liz Malady, MD;  Location: [facility] OR;  Service: General;  Laterality: N/A;       Home Medications    Prior to Admission medications   Medication Sig Start Date End Date Taking? Authorizing Provider  cetirizine (ZYRTEC ALLERGY) 10 MG tablet Take 1 tablet (10 mg total) by mouth daily. 06/27/22  Yes Arah Aro, Lurena Joiner, PA-C  fluticasone (FLONASE) 50 MCG/ACT nasal spray Place 2 sprays into both nostrils daily. 06/27/22  Yes Talaysia Pinheiro, Lurena Joiner, PA-C  olopatadine (PATANOL) 0.1 % ophthalmic solution Place 1 drop into both eyes 2 (two) times daily. 06/27/22  Yes Rishi Vicario, Lurena Joiner, PA-C  ibuprofen (ADVIL) 800 MG tablet Take 1 tablet (800 mg total) by mouth every 8 (eight) hours as needed (pain). 12/12/21   Zenia Resides, MD    Family History Family History  Problem Relation Age of Onset   Diabetes Mother    Stomach cancer Neg Hx    Colon cancer Neg Hx     Social History Social History   Tobacco Use   Smoking status: Former     Packs/day: .5    Types: Cigarettes    Quit date: 02/25/2017    Years since quitting: 5.3   Smokeless tobacco: Never   Tobacco comments:    Pt states he quit smoking in 2019   Vaping Use   Vaping Use: Never used  Substance Use Topics   Alcohol use: Never   Drug use: No     Allergies   Patient has no known allergies.   Review of Systems Review of Systems As per HPI  Physical Exam Triage Vital Signs ED Triage Vitals  Enc Vitals Group     BP 06/27/22 0925 120/81     Pulse Rate 06/27/22 0925 71     Resp 06/27/22 0925 18     Temp 06/27/22 0925 (!) 97.2 F (36.2 C)     Temp Source 06/27/22 0925 Oral     SpO2 06/27/22 0925 98 %     Weight --      Height --      Head Circumference --      Peak Flow --      Pain Score 06/27/22 0924 0     Pain Loc --      Pain Edu? --      Excl. in GC? --    No data found.  Updated Vital Signs BP 120/81 (BP Location: Left Arm)   Pulse 71   Temp (!) 97.2 F (36.2  C) (Oral)   Resp 18   SpO2 98%   Visual Acuity Right Eye Distance:   Left Eye Distance:   Bilateral Distance:    Right Eye Near:   Left Eye Near:    Bilateral Near:     Physical Exam Vitals and nursing note reviewed.  Constitutional:      General: He is not in acute distress. HENT:     Right Ear: Tympanic membrane and ear canal normal.     Left Ear: Tympanic membrane and ear canal normal.     Nose: No congestion or rhinorrhea.     Mouth/Throat:     Mouth: Mucous membranes are moist.     Pharynx: Oropharynx is clear. No posterior oropharyngeal erythema.  Eyes:     Conjunctiva/sclera: Conjunctivae normal.  Cardiovascular:     Rate and Rhythm: Normal rate and regular rhythm.     Pulses: Normal pulses.     Heart sounds: Normal heart sounds.  Pulmonary:     Effort: Pulmonary effort is normal.     Breath sounds: Normal breath sounds.  Musculoskeletal:     Cervical back: Normal range of motion.  Lymphadenopathy:     Cervical: No cervical adenopathy.  Skin:     General: Skin is warm and dry.  Neurological:     Mental Status: He is alert and oriented to person, place, and time.      UC Treatments / Results  Labs (all labs ordered are listed, but only abnormal results are displayed) Labs Reviewed - No data to display  EKG   Radiology No results found.  Procedures Procedures (including critical care time)  Medications Ordered in UC Medications - No data to display  Initial Impression / Assessment and Plan / UC Course  I have reviewed the triage vital signs and the nursing notes.  Pertinent labs & imaging results that were available during my care of the patient were reviewed by me and considered in my medical decision making (see chart for details).  Well-appearing, afebrile.  Unremarkable exam Suspect seasonal allergies. Recommend starting once daily allergy med and nasal spray, twice daily allergy eyedrops Discussed use of medicines and other symptomatic care. Return precautions discussed.  No questions at this time  Final Clinical Impressions(s) / UC Diagnoses   Final diagnoses:  Seasonal allergies     Discharge Instructions      Take the allergy pill (zyrtec) once daily, every day, for at least a week.  Use the nasal spray (flonase) once daily, every day, for at least a week.  Use the eye drops (patanol) twice daily, every day, for at least a week.  You can continue these medications as long as needed if they are helpful.     ED Prescriptions     Medication Sig Dispense Auth. Provider   cetirizine (ZYRTEC ALLERGY) 10 MG tablet Take 1 tablet (10 mg total) by mouth daily. 30 tablet Ruberta Holck, PA-C   fluticasone (FLONASE) 50 MCG/ACT nasal spray Place 2 sprays into both nostrils daily. 9.9 mL Kaitlyn Skowron, PA-C   olopatadine (PATANOL) 0.1 % ophthalmic solution Place 1 drop into both eyes 2 (two) times daily. 5 mL Weston Fulco, Lurena Joiner, PA-C      PDMP not reviewed this encounter.   Dyon Rotert, Lurena Joiner,  [place] 06/27/22 1058

## 2022-06-27 NOTE — Discharge Instructions (Signed)
Take the allergy pill (zyrtec) once daily, every day, for at least a week.  Use the nasal spray (flonase) once daily, every day, for at least a week.  Use the eye drops (patanol) twice daily, every day, for at least a week.  You can continue these medications as long as needed if they are helpful.

## 2022-06-30 ENCOUNTER — Encounter: Payer: Self-pay | Admitting: Sports Medicine

## 2022-06-30 ENCOUNTER — Ambulatory Visit (INDEPENDENT_AMBULATORY_CARE_PROVIDER_SITE_OTHER): Payer: Medicaid Other | Admitting: Sports Medicine

## 2022-06-30 ENCOUNTER — Other Ambulatory Visit (INDEPENDENT_AMBULATORY_CARE_PROVIDER_SITE_OTHER): Payer: Medicaid Other

## 2022-06-30 DIAGNOSIS — G8929 Other chronic pain: Secondary | ICD-10-CM

## 2022-06-30 DIAGNOSIS — M545 Low back pain, unspecified: Secondary | ICD-10-CM

## 2022-06-30 DIAGNOSIS — M546 Pain in thoracic spine: Secondary | ICD-10-CM

## 2022-06-30 DIAGNOSIS — M542 Cervicalgia: Secondary | ICD-10-CM

## 2022-06-30 MED ORDER — METHYLPREDNISOLONE 4 MG PO TBPK: ORAL_TABLET | ORAL | 0 refills | Status: DC
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
## 2022-07-23 ENCOUNTER — Encounter: Payer: Self-pay | Admitting: Physical Therapy

## 2022-07-23 ENCOUNTER — Other Ambulatory Visit: Payer: Self-pay

## 2022-07-23 ENCOUNTER — Ambulatory Visit: Payer: Medicaid Other | Attending: Sports Medicine | Admitting: Physical Therapy

## 2022-07-23 DIAGNOSIS — M542 Cervicalgia: Secondary | ICD-10-CM | POA: Diagnosis present

## 2022-07-23 DIAGNOSIS — M545 Low back pain, unspecified: Secondary | ICD-10-CM | POA: Insufficient documentation

## 2022-07-23 DIAGNOSIS — M546 Pain in thoracic spine: Secondary | ICD-10-CM | POA: Diagnosis present

## 2022-07-23 DIAGNOSIS — M5459 Other low back pain: Secondary | ICD-10-CM | POA: Insufficient documentation

## 2022-07-23 DIAGNOSIS — M6281 Muscle weakness (generalized): Secondary | ICD-10-CM | POA: Insufficient documentation

## 2022-07-23 DIAGNOSIS — G8929 Other chronic pain: Secondary | ICD-10-CM | POA: Diagnosis not present
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
## 2022-08-05 ENCOUNTER — Ambulatory Visit: Payer: Medicaid Other | Admitting: Physical Therapy

## 2022-08-05 ENCOUNTER — Encounter: Payer: Self-pay | Admitting: Physical Therapy

## 2022-08-05 DIAGNOSIS — M546 Pain in thoracic spine: Secondary | ICD-10-CM

## 2022-08-05 DIAGNOSIS — M5459 Other low back pain: Secondary | ICD-10-CM

## 2022-08-05 DIAGNOSIS — M6281 Muscle weakness (generalized): Secondary | ICD-10-CM
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
## 2022-08-06 ENCOUNTER — Other Ambulatory Visit: Payer: Self-pay

## 2022-08-06 ENCOUNTER — Emergency Department (HOSPITAL_COMMUNITY): Payer: Medicaid Other

## 2022-08-06 ENCOUNTER — Emergency Department (HOSPITAL_COMMUNITY)
Admission: EM | Admit: 2022-08-06 | Discharge: 2022-08-06 | Disposition: A | Discharge status: Home / Self Care | Payer: Medicaid Other | Attending: Emergency Medicine | Admitting: Emergency Medicine

## 2022-08-06 ENCOUNTER — Encounter (HOSPITAL_COMMUNITY): Payer: Self-pay | Admitting: Emergency Medicine

## 2022-08-06 DIAGNOSIS — M79604 Pain in right leg: Secondary | ICD-10-CM | POA: Insufficient documentation

## 2022-08-06 DIAGNOSIS — R0789 Other chest pain: Secondary | ICD-10-CM | POA: Insufficient documentation

## 2022-08-06 DIAGNOSIS — S0990XA Unspecified injury of head, initial encounter: Secondary | ICD-10-CM | POA: Diagnosis present

## 2022-08-06 DIAGNOSIS — M546 Pain in thoracic spine: Secondary | ICD-10-CM | POA: Diagnosis not present

## 2022-08-06 DIAGNOSIS — Y92410 Unspecified street and highway as the place of occurrence of the external cause: Secondary | ICD-10-CM | POA: Insufficient documentation

## 2022-08-06 DIAGNOSIS — M542 Cervicalgia: Secondary | ICD-10-CM | POA: Insufficient documentation

## 2022-08-06 MED ORDER — KETOROLAC TROMETHAMINE 60 MG/2ML IM SOLN
30.0000 mg | Freq: Once | INTRAMUSCULAR | Status: AC
  Administered 2022-08-06: 30 mg via INTRAMUSCULAR
  Filled 2022-08-06: qty 2

## 2022-08-06 MED ORDER — METHOCARBAMOL 500 MG PO TABS
1000.0000 mg | ORAL_TABLET | Freq: Once | ORAL | Status: AC
  Administered 2022-08-06: 1000 mg via ORAL
  Filled 2022-08-06: qty 2

## 2022-08-06 MED ORDER — METHOCARBAMOL 500 MG PO TABS: 500.0000 mg | ORAL_TABLET | Freq: Four times a day (QID) | ORAL | 0 refills | Status: DC | PRN

## 2022-08-06 MED ORDER — OXYCODONE-ACETAMINOPHEN 5-325 MG PO TABS
1.0000 | ORAL_TABLET | Freq: Once | ORAL | Status: AC
  Administered 2022-08-06: 1 via ORAL
  Filled 2022-08-06: qty 1
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
## 2022-08-06 NOTE — Discharge Instructions (Signed)
Continue ibuprofen and Tylenol at home.  A prescription for a muscle relaxer was sent to your pharmacy to take as needed.  Return to the emergency department for any new or worsening symptoms of concern.

## 2022-08-11 ENCOUNTER — Ambulatory Visit: Payer: Medicaid Other | Admitting: Allergy & Immunology

## 2022-08-12 ENCOUNTER — Ambulatory Visit: Payer: Medicaid Other | Admitting: Physical Therapy

## 2022-08-16 ENCOUNTER — Ambulatory Visit: Payer: Medicaid Other | Attending: Sports Medicine | Admitting: Physical Therapy

## 2022-08-16 ENCOUNTER — Encounter: Payer: Self-pay | Admitting: Physical Therapy

## 2022-08-16 DIAGNOSIS — M6281 Muscle weakness (generalized): Secondary | ICD-10-CM | POA: Diagnosis present

## 2022-08-16 DIAGNOSIS — M546 Pain in thoracic spine: Secondary | ICD-10-CM | POA: Diagnosis present

## 2022-08-16 DIAGNOSIS — M542 Cervicalgia: Secondary | ICD-10-CM | POA: Diagnosis present

## 2022-08-16 DIAGNOSIS — M5459 Other low back pain: Secondary | ICD-10-CM | POA: Insufficient documentation
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
## 2022-08-23 ENCOUNTER — Ambulatory Visit (INDEPENDENT_AMBULATORY_CARE_PROVIDER_SITE_OTHER): Payer: Medicaid Other | Admitting: Sports Medicine

## 2022-08-23 ENCOUNTER — Encounter: Payer: Self-pay | Admitting: Sports Medicine

## 2022-08-23 DIAGNOSIS — G8929 Other chronic pain: Secondary | ICD-10-CM

## 2022-08-23 DIAGNOSIS — M542 Cervicalgia: Secondary | ICD-10-CM

## 2022-08-23 DIAGNOSIS — M545 Low back pain, unspecified: Secondary | ICD-10-CM

## 2022-08-23 DIAGNOSIS — S46811S Strain of other muscles, fascia and tendons at shoulder and upper arm level, right arm, sequela: Secondary | ICD-10-CM

## 2022-08-23 DIAGNOSIS — M546 Pain in thoracic spine: Secondary | ICD-10-CM | POA: Diagnosis not present

## 2022-08-23 MED ORDER — BUPIVACAINE HCL 0.25 % IJ SOLN
1.00 mL | INTRAMUSCULAR | Status: AC | PRN
Start: 2022-08-23 — End: 2022-08-23
  Administered 2022-08-23: 1 mL

## 2022-08-23 MED ORDER — METHYLPREDNISOLONE ACETATE 40 MG/ML IJ SUSP
40.00 mg | INTRAMUSCULAR | Status: AC | PRN
Start: 2022-08-23 — End: 2022-08-23
  Administered 2022-08-23: 40 mg via INTRAMUSCULAR

## 2022-08-23 MED ORDER — BUPIVACAINE HCL 0.25 % IJ SOLN
1.0000 mL | INTRAMUSCULAR | Status: AC | PRN
Start: 2022-08-23 — End: 2022-08-23
  Administered 2022-08-23: 1 mL

## 2022-08-23 MED ORDER — LIDOCAINE HCL 1 % IJ SOLN
1.00 mL | INTRAMUSCULAR | Status: AC | PRN
Start: 2022-08-23 — End: 2022-08-23
  Administered 2022-08-23: 1 mL

## 2022-08-23 MED ORDER — LIDOCAINE HCL 1 % IJ SOLN
1.0000 mL | INTRAMUSCULAR | Status: AC | PRN
Start: 2022-08-23 — End: 2022-08-23
  Administered 2022-08-23: 1 mL
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
## 2022-08-30 ENCOUNTER — Ambulatory Visit: Payer: Medicaid Other | Admitting: Sports Medicine

## 2022-09-17 ENCOUNTER — Ambulatory Visit (HOSPITAL_COMMUNITY)
Admission: EM | Admit: 2022-09-17 | Discharge: 2022-09-17 | Disposition: A | Discharge status: Home / Self Care | Payer: Medicaid Other | Attending: Family | Admitting: Family

## 2022-09-17 ENCOUNTER — Encounter (HOSPITAL_COMMUNITY): Payer: Self-pay

## 2022-09-17 DIAGNOSIS — R350 Frequency of micturition: Secondary | ICD-10-CM | POA: Diagnosis not present

## 2022-09-17 DIAGNOSIS — R8290 Unspecified abnormal findings in urine: Secondary | ICD-10-CM | POA: Insufficient documentation

## 2022-09-17 DIAGNOSIS — Z758 Other problems related to medical facilities and other health care: Secondary | ICD-10-CM | POA: Diagnosis present

## 2022-09-17 DIAGNOSIS — Z833 Family history of diabetes mellitus: Secondary | ICD-10-CM | POA: Diagnosis present

## 2022-09-17 LAB — POCT URINALYSIS DIP (MANUAL ENTRY)
Bilirubin, UA: NEGATIVE
Glucose, UA: NEGATIVE mg/dL
Ketones, POC UA: NEGATIVE mg/dL
Leukocytes, UA: NEGATIVE
Nitrite, UA: NEGATIVE
Protein Ur, POC: NEGATIVE mg/dL
Spec Grav, UA: 1.02 (ref 1.010–1.025)
Urobilinogen, UA: 0.2 E.U./dL
pH, UA: 5.5 (ref 5.0–8.0)

## 2022-09-17 LAB — POCT FASTING CBG KUC MANUAL ENTRY: POCT Glucose (KUC): 107 mg/dL — AB (ref 70–99)

## 2022-09-17 NOTE — ED Triage Notes (Signed)
Patient here today with c/o urinary frequency at night, fatigue, and dizziness X 2 days.
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
## 2022-09-17 NOTE — Discharge Instructions (Addendum)
-  Drink enough water and stay hydrated -Increase physical activities and eat plant-based diet -Schedule visit with primary care within the next 7 days for physical exam -Report new or worsening symptoms to the clinic or local ED

## 2022-09-18 LAB — URINE CULTURE: Culture: NO GROWTH

## 2022-10-03 ENCOUNTER — Ambulatory Visit: Payer: Medicaid Other | Attending: Sports Medicine | Admitting: Physical Therapy

## 2022-10-28 ENCOUNTER — Other Ambulatory Visit: Payer: Self-pay

## 2022-10-28 ENCOUNTER — Encounter: Payer: Self-pay | Admitting: Physical Therapy

## 2022-10-28 ENCOUNTER — Ambulatory Visit: Payer: Medicaid Other | Attending: Sports Medicine | Admitting: Physical Therapy

## 2022-10-28 DIAGNOSIS — M6281 Muscle weakness (generalized): Secondary | ICD-10-CM | POA: Insufficient documentation

## 2022-10-28 DIAGNOSIS — M25562 Pain in left knee: Secondary | ICD-10-CM | POA: Insufficient documentation

## 2022-10-28 DIAGNOSIS — G8929 Other chronic pain: Secondary | ICD-10-CM | POA: Diagnosis present

## 2022-10-28 DIAGNOSIS — M25561 Pain in right knee: Secondary | ICD-10-CM | POA: Insufficient documentation

## 2022-10-28 NOTE — Patient Instructions (Signed)
Access Code: [email or web address] URL: [email or web address] Date: 10/28/2022 Prepared by: Rosana Hoes  Exercises - Active Straight Leg Raise with Quad Set  - 1 x daily - 3 sets - 10 reps - Clamshell with Resistance  - 1 x daily - 3 sets - 10 reps - Wall Squat  - 1 x daily - 3 sets - 10 reps - Single Leg Balance with Eyes Closed  - 1 x daily - 3 sets - 30 seconds hold

## 2022-12-16 ENCOUNTER — Other Ambulatory Visit (INDEPENDENT_AMBULATORY_CARE_PROVIDER_SITE_OTHER): Payer: Medicaid Other

## 2022-12-16 ENCOUNTER — Encounter: Payer: Self-pay | Admitting: Sports Medicine

## 2022-12-16 ENCOUNTER — Ambulatory Visit: Payer: Medicaid Other | Admitting: Sports Medicine

## 2022-12-16 DIAGNOSIS — M546 Pain in thoracic spine: Secondary | ICD-10-CM | POA: Diagnosis not present

## 2022-12-16 DIAGNOSIS — M545 Low back pain, unspecified: Secondary | ICD-10-CM | POA: Diagnosis not present

## 2022-12-16 DIAGNOSIS — M542 Cervicalgia: Secondary | ICD-10-CM

## 2022-12-16 DIAGNOSIS — G8929 Other chronic pain: Secondary | ICD-10-CM

## 2022-12-16 MED ORDER — METHOCARBAMOL 500 MG PO TABS: 500.0000 mg | ORAL_TABLET | Freq: Three times a day (TID) | ORAL | 0 refills | Status: AC | PRN
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
## 2023-01-06 ENCOUNTER — Ambulatory Visit: Payer: Medicaid Other | Admitting: Sports Medicine

## 2023-01-31 ENCOUNTER — Ambulatory Visit: Payer: Medicaid Other | Admitting: Sports Medicine

## 2023-01-31 ENCOUNTER — Encounter: Payer: Self-pay | Admitting: Sports Medicine

## 2023-01-31 DIAGNOSIS — M9901 Segmental and somatic dysfunction of cervical region: Secondary | ICD-10-CM | POA: Diagnosis not present

## 2023-01-31 DIAGNOSIS — M9902 Segmental and somatic dysfunction of thoracic region: Secondary | ICD-10-CM

## 2023-01-31 DIAGNOSIS — S46811S Strain of other muscles, fascia and tendons at shoulder and upper arm level, right arm, sequela: Secondary | ICD-10-CM

## 2023-01-31 DIAGNOSIS — M542 Cervicalgia: Secondary | ICD-10-CM | POA: Diagnosis not present

## 2023-01-31 DIAGNOSIS — M899 Disorder of bone, unspecified: Secondary | ICD-10-CM | POA: Diagnosis not present

## 2023-01-31 MED ORDER — METHYLPREDNISOLONE 4 MG PO TBPK: ORAL_TABLET | ORAL | 0 refills | Status: DC
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
## 2023-05-23 ENCOUNTER — Ambulatory Visit
Admission: RE | Admit: 2023-05-23 | Discharge: 2023-05-23 | Disposition: A | Discharge status: Home / Self Care | Source: Ambulatory Visit | Attending: Family Medicine | Admitting: Family Medicine

## 2023-05-23 ENCOUNTER — Ambulatory Visit: Payer: Self-pay

## 2023-05-23 VITALS — BP 112/61 | HR 97 | Temp 98.4°F | Resp 16

## 2023-05-23 DIAGNOSIS — B353 Tinea pedis: Secondary | ICD-10-CM | POA: Diagnosis not present

## 2023-05-23 MED ORDER — FLUCONAZOLE 150 MG PO TABS: 150.0000 mg | ORAL_TABLET | ORAL | 0 refills | Status: AC

## 2023-05-23 NOTE — ED Triage Notes (Addendum)
 Patient presents to the office for right foot and toes itching x 10 years. Home Intervention: Cream

## 2023-05-23 NOTE — ED Provider Notes (Signed)
 [facility]  Note:  This document was prepared using Conservation officer, historic buildings and may include unintentional dictation errors.  MRN: [id number] DOB: 1990/04/15  Subjective:   Sean Ingram is a 33 y.o. male presenting for several year history of persistent itchy rash over the right foot.  Over the years has tried multiple over-the-counter creams.  Tries to keep his shoes clean, washes them and scrubs them, dries them out in the sun.  No fever, tenderness, drainage of pus or bleeding.  He states sometimes they do sweat and get moist especially in between the toes where the rashes primarily.  No current facility-administered medications for this encounter.  Current Outpatient Medications:    cetirizine (ZYRTEC ALLERGY) 10 MG tablet, Take 1 tablet (10 mg total) by mouth daily., Disp: 30 tablet, Rfl: 2   fluticasone (FLONASE) 50 MCG/ACT nasal spray, Place 2 sprays into both nostrils daily., Disp: 9.9 mL, Rfl: 2   ibuprofen (ADVIL) 800 MG tablet, Take 1 tablet (800 mg total) by mouth every 8 (eight) hours as needed (pain)., Disp: 21 tablet, Rfl: 0   meloxicam (MOBIC) 15 MG tablet, Take 15 mg by mouth daily., Disp: , Rfl:    methocarbamol (ROBAXIN) 500 MG tablet, Take 1 tablet (500 mg total) by mouth every 8 (eight) hours as needed for muscle spasms., Disp: 30 tablet, Rfl: 0   methylPREDNISolone (MEDROL DOSEPAK) 4 MG TBPK tablet, Take per packet instructions. Taper dosing., Disp: 1 each, Rfl: 0   olopatadine (PATANOL) 0.1 % ophthalmic solution, Place 1 drop into both eyes 2 (two) times daily., Disp: 5 mL, Rfl: 2   No Known Allergies  History reviewed. No pertinent past medical history.   Past Surgical History:  Procedure Laterality Date   APPENDECTOMY  09/12/2012   KNEE ARTHROSCOPY W/ PARTIAL MEDIAL MENISCECTOMY Left 09/23/2021   LAPAROSCOPIC APPENDECTOMY N/A 09/13/2012   Procedure: APPENDECTOMY LAPAROSCOPIC;  Surgeon: Liz Malady, MD;  Location: [facility]  OR;  Service: General;  Laterality: N/A;    Family History  Problem Relation Age of Onset   Diabetes Mother    Stomach cancer Neg Hx    Colon cancer Neg Hx     Social History   Tobacco Use   Smoking status: Former    Current packs/day: 0.00    Types: Cigarettes    Quit date: 02/25/2017    Years since quitting: 6.2   Smokeless tobacco: Never   Tobacco comments:    Pt states he quit smoking in 2019   Vaping Use   Vaping status: Never Used  Substance Use Topics   Alcohol use: Never   Drug use: No    ROS   Objective:   Vitals: BP 112/61 (BP Location: Left Arm)   Pulse 97   Temp 98.4 F (36.9 C) (Oral)   Resp 16   SpO2 97%   Physical Exam Constitutional:      General: He is not in acute distress.    Appearance: Normal appearance. He is well-developed and normal weight. He is not ill-appearing, toxic-appearing or diaphoretic.  HENT:     Head: Normocephalic and atraumatic.     Right Ear: External ear normal.     Left Ear: External ear normal.     Nose: Nose normal.     Mouth/Throat:     Pharynx: Oropharynx is clear.  Eyes:     General: No scleral icterus.       Right eye: No discharge.  Left eye: No discharge.     Extraocular Movements: Extraocular movements intact.  Cardiovascular:     Rate and Rhythm: Normal rate.  Pulmonary:     Effort: Pulmonary effort is normal.  Musculoskeletal:     Cervical back: Normal range of motion.       Feet:  Neurological:     Mental Status: He is alert and oriented to person, place, and time.  Psychiatric:        Mood and Affect: Mood normal.        Behavior: Behavior normal.        Thought Content: Thought content normal.        Judgment: Judgment normal.     Assessment and Plan :   PDMP not reviewed this encounter.  1. Tinea pedis of right foot    Recommended extended course of oral fluconazole for tinea pedis.  Counseled patient on potential for adverse effects with medications prescribed/recommended  today, ER and return-to-clinic precautions discussed, patient verbalized understanding.    Sean Ingram, [place] 05/23/23 1731

## 2023-06-07 ENCOUNTER — Encounter: Payer: Self-pay | Admitting: Allergy

## 2023-06-07 ENCOUNTER — Other Ambulatory Visit: Payer: Self-pay

## 2023-06-07 ENCOUNTER — Ambulatory Visit: Admitting: Allergy

## 2023-06-07 VITALS — BP 124/70 | HR 85 | Temp 98.8°F | Resp 16 | Ht 67.0 in | Wt 190.5 lb

## 2023-06-07 DIAGNOSIS — H1013 Acute atopic conjunctivitis, bilateral: Secondary | ICD-10-CM

## 2023-06-07 DIAGNOSIS — H109 Unspecified conjunctivitis: Secondary | ICD-10-CM

## 2023-06-07 DIAGNOSIS — J310 Chronic rhinitis: Secondary | ICD-10-CM

## 2023-06-07 MED ORDER — AZELASTINE-FLUTICASONE 137-50 MCG/ACT NA SUSP: 1.0000 | Freq: Two times a day (BID) | NASAL | 5 refills | Status: DC | PRN

## 2023-06-07 MED ORDER — LEVOCETIRIZINE DIHYDROCHLORIDE 5 MG PO TABS: 5.0000 mg | ORAL_TABLET | Freq: Every evening | ORAL | 5 refills | Status: DC

## 2023-06-07 MED ORDER — CROMOLYN SODIUM 4 % OP SOLN: 1.0000 [drp] | Freq: Four times a day (QID) | OPHTHALMIC | 5 refills | Status: DC | PRN

## 2023-06-07 NOTE — Patient Instructions (Signed)
 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  This is an long-acting antihistamine.  Dymista  1 spray each nostril twice a day as needed for runny or stuffy nose.  With using nasal sprays point tip of bottle toward eye on same side nostril and lean head slightly forward for best technique.   Cromolyn  1 drop each eye up to 4 times a day as needed for itchy/watery eyes.  - You can use an extra dose of the antihistamine, if needed, for breakthrough symptoms.  - Consider nasal saline rinses 1-2 times daily to remove allergens from the nasal cavities as well as help with mucous clearance (this is especially helpful to do before the nasal sprays are given) - Schedule skin testing visit and hold antihistamines for 3 days prior to this visit.   Schedule skin testing Routine follow-up in 4-6 months or sooner if needed

## 2023-06-07 NOTE — Progress Notes (Signed)
New Patient Note  RE: Sean Ingram MRN: [id number] DOB: May 13, 1990 Date of Office Visit: 06/07/2023  Primary care provider: Patient, No Pcp Per  Chief Complaint: allergies  History of present illness: Sean Ingram is a 33 y.o. male presenting today for evaluation of allergies.  Discussed the use of AI scribe software for clinical note transcription with the patient, who gave verbal consent to proceed.  He experiences sneezing, itchy and watery eyes, nasal itching, and ear itching, primarily during the spring and fall seasons. He frequently uses a napkin due to nasal symptoms, although there is often no discharge. He sometimes has difficulty breathing through his nose, especially at night, which worsens his symptoms. He experiences a sensation of something in his throat, which he alleviates by drinking water. He has previously tried cetirizine  (Zyrtec ) and Flonase  nasal spray. Initially, these treatments were effective, but their efficacy has diminished over time. No history of asthma or the use of inhalers. He also denies any food allergies but mentions not eating pork for religious reasons.  No history of eczema, but he notes that his body becomes itchy at times.   Review of systems: 10pt ROS negative unless noted above in HPI  Past medical history: History reviewed. No pertinent past medical history.  Past surgical history: Past Surgical History:  Procedure Laterality Date   APPENDECTOMY  09/12/2012   KNEE ARTHROSCOPY W/ PARTIAL MEDIAL MENISCECTOMY Left 09/23/2021   LAPAROSCOPIC APPENDECTOMY N/A 09/13/2012   Procedure: APPENDECTOMY LAPAROSCOPIC;  Surgeon: Cloyce Darby, MD;  Location: [facility] OR;  Service: General;  Laterality: N/A;    Family history:  Family History  Problem Relation Age of Onset   Diabetes Mother    Stomach cancer Neg Hx    Colon cancer Neg Hx     Social history: Lives in an apartment without carpeting with gas and electric heating with window  and fan cooling.  No pets in the home.  There is no concern for water damage, mildew or roaches in the home.  He works in delivery.  He has no smoking history.   Medication List: Current Outpatient Medications  Medication Sig Dispense Refill   fluconazole  (DIFLUCAN ) 150 MG tablet Take 1 tablet (150 mg total) by mouth once a week. 12 tablet 0   cetirizine  (ZYRTEC  ALLERGY) 10 MG tablet Take 1 tablet (10 mg total) by mouth daily. (Patient not taking: Reported on 06/07/2023) 30 tablet 2   fluticasone  (FLONASE ) 50 MCG/ACT nasal spray Place 2 sprays into both nostrils daily. (Patient not taking: Reported on 06/07/2023) 9.9 mL 2   ibuprofen  (ADVIL ) 800 MG tablet Take 1 tablet (800 mg total) by mouth every 8 (eight) hours as needed (pain). (Patient not taking: Reported on 06/07/2023) 21 tablet 0   meloxicam (MOBIC) 15 MG tablet Take 15 mg by mouth daily. (Patient not taking: Reported on 06/07/2023)     methocarbamol  (ROBAXIN ) 500 MG tablet Take 1 tablet (500 mg total) by mouth every 8 (eight) hours as needed for muscle spasms. (Patient not taking: Reported on 06/07/2023) 30 tablet 0   methylPREDNISolone  (MEDROL  DOSEPAK) 4 MG TBPK tablet Take per packet instructions. Taper dosing. (Patient not taking: Reported on 06/07/2023) 1 each 0   olopatadine  (PATANOL) 0.1 % ophthalmic solution Place 1 drop into both eyes 2 (two) times daily. (Patient not taking: Reported on 06/07/2023) 5 mL 2   No current facility-administered medications for this visit.    Known medication allergies: No Known Allergies   Physical examination:  Blood pressure 124/70, pulse 85, temperature 98.8 F (37.1 C), temperature source Temporal, resp. rate 16, height 5\' 7"  (1.702 m), weight 190 lb 8 oz (86.4 kg), SpO2 98%.  General: Alert, interactive, in no acute distress. HEENT: PERRLA, TMs pearly gray, turbinates mildly edematous without discharge, post-pharynx non erythematous. Neck: Supple without lymphadenopathy. Lungs: Clear to  auscultation without wheezing, rhonchi or rales. {no increased work of breathing. CV: Normal S1, S2 without murmurs. Abdomen: Nondistended, nontender. Skin: Warm and dry, without lesions or rashes. Extremities:  No clubbing, cyanosis or edema. Neuro:   Grossly intact.  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  This is an long-acting antihistamine.  Dymista  1 spray each nostril twice a day as needed for runny or stuffy nose.  With using nasal sprays point tip of bottle toward eye on same side nostril and lean head slightly forward for best technique.   Cromolyn  1 drop each eye up to 4 times a day as needed for itchy/watery eyes.  - You can use an extra dose of the antihistamine, if needed, for breakthrough symptoms.  - Consider nasal saline rinses 1-2 times daily to remove allergens from the nasal cavities as well as help with mucous clearance (this is especially helpful to do before the nasal sprays are given) - Schedule skin testing visit and hold antihistamines for 3 days prior to this visit.   Schedule skin testing (environment 1-55)  Routine follow-up in 4-6 months or sooner if needed  I appreciate the opportunity to take part in Sean Ingram's care. Please do not hesitate to contact me with questions.  Sincerely,   Sean Clink, MD Allergy/Immunology [facility]

## 2023-06-16 ENCOUNTER — Encounter: Payer: Self-pay | Admitting: Allergy

## 2023-06-16 ENCOUNTER — Ambulatory Visit: Admitting: Allergy

## 2023-06-16 DIAGNOSIS — H1013 Acute atopic conjunctivitis, bilateral: Secondary | ICD-10-CM

## 2023-06-16 DIAGNOSIS — J302 Other seasonal allergic rhinitis: Secondary | ICD-10-CM | POA: Diagnosis not present

## 2023-06-16 DIAGNOSIS — J3089 Other allergic rhinitis: Secondary | ICD-10-CM

## 2023-06-16 NOTE — Progress Notes (Signed)
Follow-up Note  RE: Sean Ingram MRN: [id number] DOB: 01/15/1991 Date of Office Visit: 06/16/2023   History of present illness: Sean Ingram is a 33 y.o. male presenting today for skin testing visit.  He was last seen in the office on 06/07/23 for rhinoconjunctivitis.  He is in his usual state of health today without recent illness.  He has held antihistamines for at least 3 days for testing today.   Medication List: Current Outpatient Medications  Medication Sig Dispense Refill   Azelastine -Fluticasone  (DYMISTA ) 137-50 MCG/ACT SUSP Place 1 spray into the nose 2 (two) times daily as needed (Runny or stuffy nose). 23 g 5   cetirizine  (ZYRTEC  ALLERGY) 10 MG tablet Take 1 tablet (10 mg total) by mouth daily. (Patient not taking: Reported on 06/07/2023) 30 tablet 2   cromolyn  (OPTICROM ) 4 % ophthalmic solution Place 1 drop into both eyes 4 (four) times daily as needed (Itchy watery eyes). 10 mL 5   fluconazole  (DIFLUCAN ) 150 MG tablet Take 1 tablet (150 mg total) by mouth once a week. 12 tablet 0   fluticasone  (FLONASE ) 50 MCG/ACT nasal spray Place 2 sprays into both nostrils daily. (Patient not taking: Reported on 06/07/2023) 9.9 mL 2   ibuprofen  (ADVIL ) 800 MG tablet Take 1 tablet (800 mg total) by mouth every 8 (eight) hours as needed (pain). (Patient not taking: Reported on 06/07/2023) 21 tablet 0   levocetirizine (XYZAL ) 5 MG tablet Take 1 tablet (5 mg total) by mouth every evening. 30 tablet 5   meloxicam (MOBIC) 15 MG tablet Take 15 mg by mouth daily. (Patient not taking: Reported on 06/07/2023)     methocarbamol  (ROBAXIN ) 500 MG tablet Take 1 tablet (500 mg total) by mouth every 8 (eight) hours as needed for muscle spasms. (Patient not taking: Reported on 06/07/2023) 30 tablet 0   methylPREDNISolone  (MEDROL  DOSEPAK) 4 MG TBPK tablet Take per packet instructions. Taper dosing. (Patient not taking: Reported on 06/07/2023) 1 each 0   olopatadine  (PATANOL) 0.1 % ophthalmic solution  Place 1 drop into both eyes 2 (two) times daily. (Patient not taking: Reported on 06/07/2023) 5 mL 2   No current facility-administered medications for this visit.     Known medication allergies: No Known Allergies Diagnostics/Labs:  Allergy testing:   Airborne Adult Perc - 06/16/23 0900     Time Antigen Placed 0901    Allergen Manufacturer Floyd Hutchinson    Location Back    Number of Test 55    1. Control-Buffer 50% Glycerol Negative    2. Control-Histamine 2+    3. Bahia 2+    4. [place] 2+    5. Johnson 2+    6. [place]  Blue 2+    7. Meadow Fescue 2+    8. Perennial Rye 2+    9. Timothy Negative    10. Ragweed Mix 2+    11. Cocklebur 2+    12. Plantain,  English 2+    13. Baccharis 2+    14. Dog Fennel 2+    15. Gu[place] Thistle 2+    16. Lamb's Quarters 2+    17. Sheep Sorrell 2+    18. Rough Pigweed 2+    19. Marsh Elder, Rough 2+    20. Mugwort, Common 2+    21. Box, Elder 2+    22. Cedar, red 2+    23. Sweet Gum Negative    24. Pecan Pollen 4+    25. Pine Mix Negative    26.  Walnut, Black Pollen 2+    27. Red Mulberry 2+    28. Ash Mix 2+    29. Birch Mix 2+    30. Beech American 2+    31. Cottonwood, [place] Negative    32. Hickory, White 4+    33. Maple Mix Negative    34. Oak, [place] Mix 4+    35. Sycamore Eastern 2+    36. Alternaria Alternata Negative    37. Cladosporium Herbarum Negative    38. Aspergillus Mix Negative    39. Penicillium Mix Negative    40. Bipolaris Sorokiniana (Helminthosporium) Negative    41. Drechslera Spicifera (Curvularia) Negative    42. Mucor Plumbeus Negative    43. Fusarium Moniliforme 2+    44. Aureobasidium Pullulans (pullulara) 2+    45. Rhizopus Oryzae Negative    46. Botrytis Cinera Negative    47. Epicoccum Nigrum Negative    48. Phoma Betae Negative    49. Dust Mite Mix 4+    50. Cat Hair 10,000 BAU/ml 2+    51.  Dog Epithelia Negative    52. Mixed Feathers Negative    53. Horse Epithelia Negative    54.  Cockroach, German 3+    55. Tobacco Leaf Negative             Allergy testing results were read and interpreted by provider, documented by clinical staff.   Assessment and plan: Rhinitis with conjunctivitis, allergic - Testing today showed: grasses, ragweed, weeds, trees, outdoor molds, dust mites, cat, and cockroach - Copy of test results provided.  - Avoidance measures provided.  - Recommend the following allergy regimen:  Xyzal  (levocetirizine) 5mg  tablet once daily.  This is an long-acting antihistamine. You can use an extra dose of the antihistamine, if needed, for breakthrough symptoms.  Dymista  1 spray each nostril twice a day as needed for runny or stuffy nose.  With using nasal sprays point tip of bottle toward eye on same side nostril and lean head slightly forward for best technique.   Cromolyn  1 drop each eye up to 4 times a day as needed for itchy/watery eyes.  - You can use an extra dose of the antihistamine, if needed, for breakthrough symptoms.  - Consider nasal saline rinses 1-2 times daily to remove allergens from the nasal cavities as well as help with mucous clearance (this is especially helpful to do before the nasal sprays are given)  - Consider allergy shots as a means of long-term control if medication management above is not effective enough. - Allergy shots "re-train" and "reset" the immune system to ignore environmental allergens and decrease the resulting immune response to those allergens (sneezing, itchy watery eyes, runny nose, nasal congestion, etc).    - Allergy shots improve symptoms in 75-85% of patients.  - We can discuss more at a future appointment if the medications are not working for you.   Routine follow-up in 4-6 months or sooner if needed  I appreciate the opportunity to take part in Vishruth's care. Please do not hesitate to contact me with questions.  Sincerely,   Catha Clink, MD Allergy/Immunology [facility]

## 2023-06-16 NOTE — Patient Instructions (Signed)
 Rhinitis with conjunctivitis, allergic - Testing today showed: grasses, ragweed, weeds, trees, outdoor molds, dust mites, cat, and cockroach - Copy of test results provided.  - Avoidance measures provided.  - Recommend the following allergy regimen:  Xyzal  (levocetirizine) 5mg  tablet once daily.  This is an long-acting antihistamine. You can use an extra dose of the antihistamine, if needed, for breakthrough symptoms.  Dymista  1 spray each nostril twice a day as needed for runny or stuffy nose.  With using nasal sprays point tip of bottle toward eye on same side nostril and lean head slightly forward for best technique.   Cromolyn  1 drop each eye up to 4 times a day as needed for itchy/watery eyes.  - You can use an extra dose of the antihistamine, if needed, for breakthrough symptoms.  - Consider nasal saline rinses 1-2 times daily to remove allergens from the nasal cavities as well as help with mucous clearance (this is especially helpful to do before the nasal sprays are given)  - Consider allergy shots as a means of long-term control if medication management above is not effective enough. - Allergy shots "re-train" and "reset" the immune system to ignore environmental allergens and decrease the resulting immune response to those allergens (sneezing, itchy watery eyes, runny nose, nasal congestion, etc).    - Allergy shots improve symptoms in 75-85% of patients.  - We can discuss more at a future appointment if the medications are not working for you.   Routine follow-up in 4-6 months or sooner if needed

## 2023-07-24 ENCOUNTER — Ambulatory Visit: Admitting: Sports Medicine

## 2023-07-24 ENCOUNTER — Encounter: Payer: Self-pay | Admitting: Sports Medicine

## 2023-07-24 DIAGNOSIS — M62830 Muscle spasm of back: Secondary | ICD-10-CM | POA: Diagnosis not present

## 2023-07-24 DIAGNOSIS — M545 Low back pain, unspecified: Secondary | ICD-10-CM | POA: Diagnosis not present

## 2023-07-24 DIAGNOSIS — M542 Cervicalgia: Secondary | ICD-10-CM | POA: Diagnosis not present

## 2023-07-24 DIAGNOSIS — G8929 Other chronic pain: Secondary | ICD-10-CM

## 2023-07-24 MED ORDER — BUPIVACAINE HCL 0.25 % IJ SOLN
1.0000 mL | INTRAMUSCULAR | Status: AC | PRN
  Administered 2023-07-24: 1 mL

## 2023-07-24 MED ORDER — LIDOCAINE HCL 1 % IJ SOLN
2.0000 mL | INTRAMUSCULAR | Status: AC | PRN
  Administered 2023-07-24: 2 mL

## 2023-07-24 MED ORDER — METHYLPREDNISOLONE ACETATE 40 MG/ML IJ SUSP
40.0000 mg | INTRAMUSCULAR | Status: AC | PRN
  Administered 2023-07-24: 40 mg via INTRAMUSCULAR

## 2023-07-24 MED ORDER — MELOXICAM 15 MG PO TABS: 15.0000 mg | ORAL_TABLET | Freq: Every day | ORAL | 0 refills | Status: AC
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
## 2023-07-24 NOTE — Progress Notes (Signed)
 Patient says that he has been doing pretty well, although the pain in the right side of his neck, and down the right side of his back to the low back, has returned in the last few weeks. He denies any new injuries. He denies any pain or radicular symptoms in either of the arms or the legs. He does have pain when his neck is in more of a neutral position, but he can get relief if he moves into cervical extension and cervical flexion. He also mentions that his pain worsens when he is exercising and doing any jumping movements. He continues to do his home exercises every other day.
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
## 2023-08-07 ENCOUNTER — Encounter: Payer: Self-pay | Admitting: Sports Medicine

## 2023-08-07 ENCOUNTER — Ambulatory Visit: Admitting: Sports Medicine

## 2023-08-07 DIAGNOSIS — M5030 Other cervical disc degeneration, unspecified cervical region: Secondary | ICD-10-CM

## 2023-08-07 DIAGNOSIS — M9901 Segmental and somatic dysfunction of cervical region: Secondary | ICD-10-CM

## 2023-08-07 DIAGNOSIS — M9907 Segmental and somatic dysfunction of upper extremity: Secondary | ICD-10-CM

## 2023-08-07 DIAGNOSIS — M9902 Segmental and somatic dysfunction of thoracic region: Secondary | ICD-10-CM

## 2023-08-07 DIAGNOSIS — G8929 Other chronic pain: Secondary | ICD-10-CM | POA: Diagnosis not present

## 2023-08-07 NOTE — Progress Notes (Signed)
 Patient says that he is feeling about 85% better. He feels okay when he lays with 2 pillows under his head, but otherwise the right side of his neck is still bothersome. He says that sitting upright is still bothersome, as well.
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
## 2023-09-01 IMAGING — DX DG CHEST 2V
2 series · 2 of 2 positions shown · non-contrast
Comparison: None.

CLINICAL DATA: Dyspnea on exertion.

EXAM:
CHEST - 2 VIEW

[chest pa]
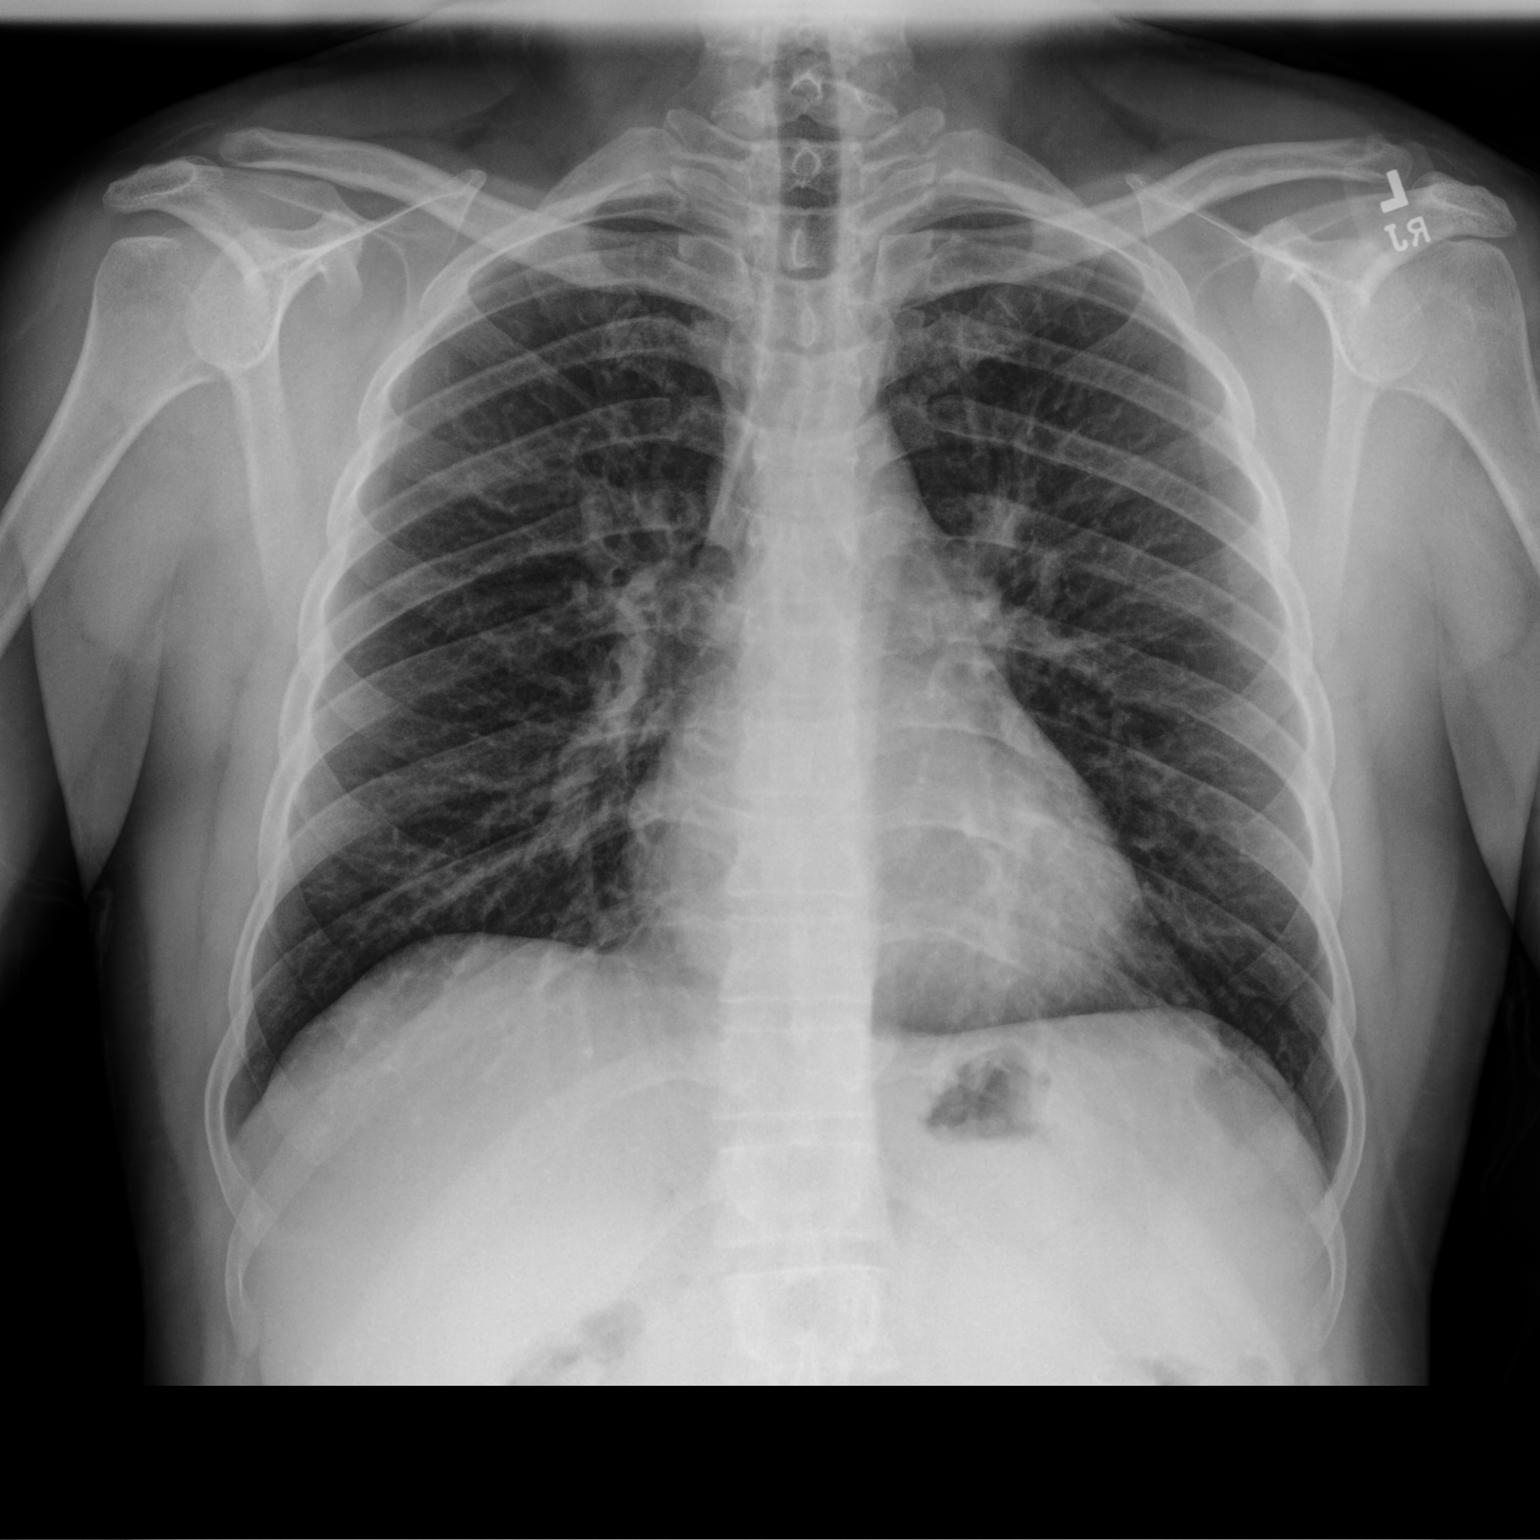

[chest lat]
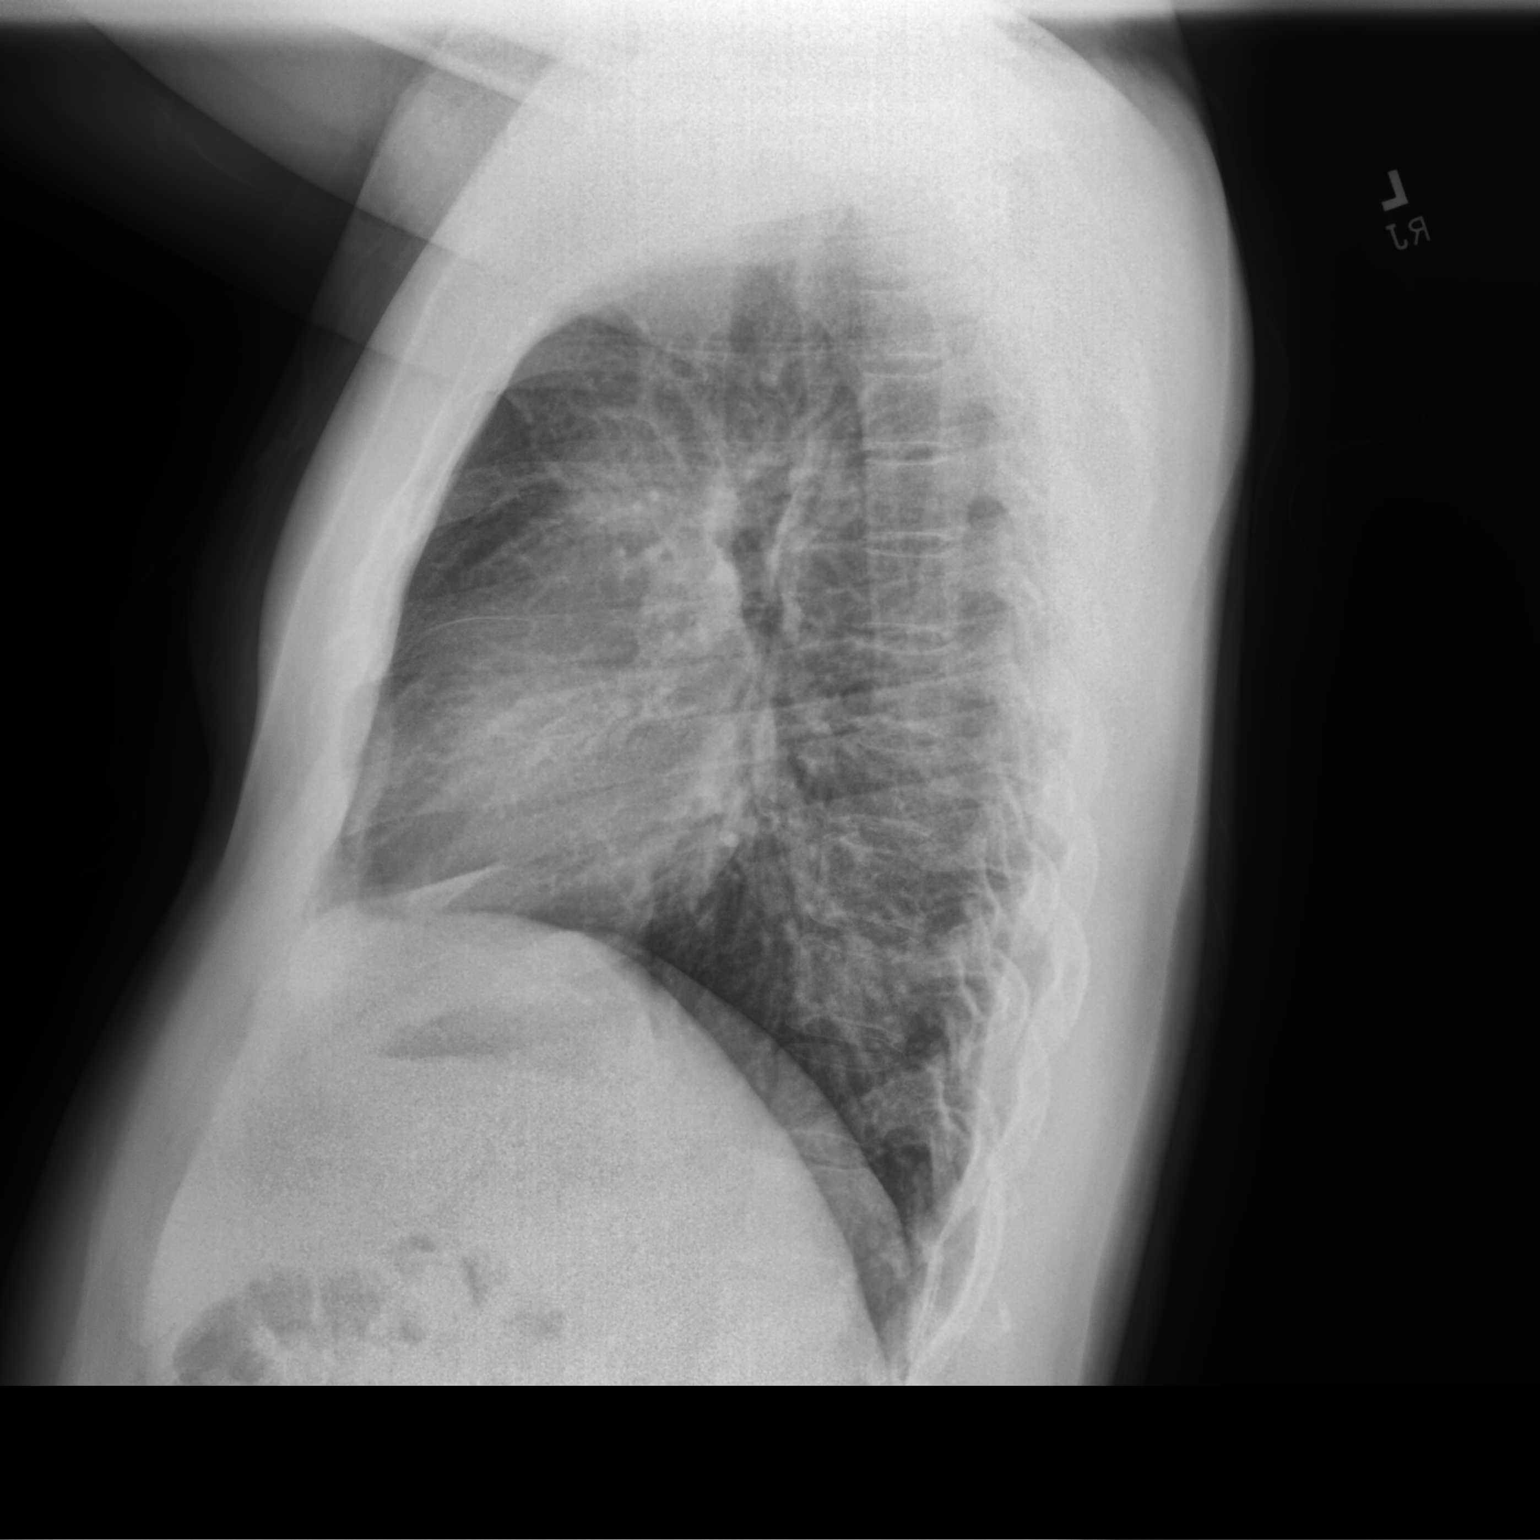

[2 of 2 positions shown; findings below may reference images not displayed]

FINDINGS: The heart size and mediastinal contours are within normal limits.
Both lungs are clear. The visualized skeletal structures are
unremarkable.
IMPRESSION: No active cardiopulmonary disease.

## 2023-10-04 IMAGING — US US ABDOMEN COMPLETE
1 series · 14 of 25 positions shown · non-contrast
Comparison: CT Abdomen and Pelvis 05/05/2021.

CLINICAL DATA: 30-year-old male with rectal bleeding and
generalized abdominal pain.

EXAM:
ABDOMEN ULTRASOUND COMPLETE

[Series 1: us abdomen complete · 14 of 85 slices shown]
[im 1/85]
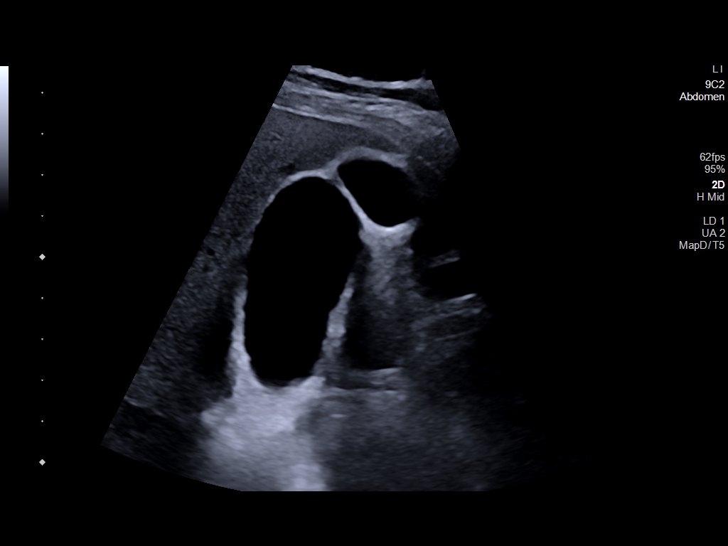
[im 8/85]
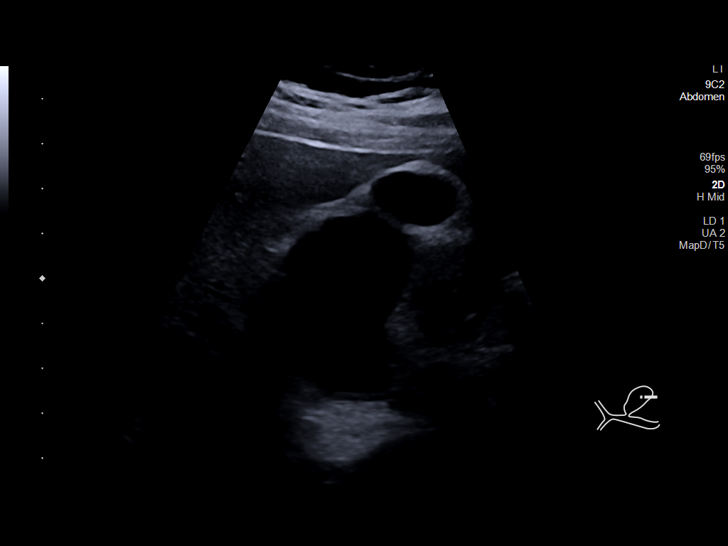
[im 15/85]
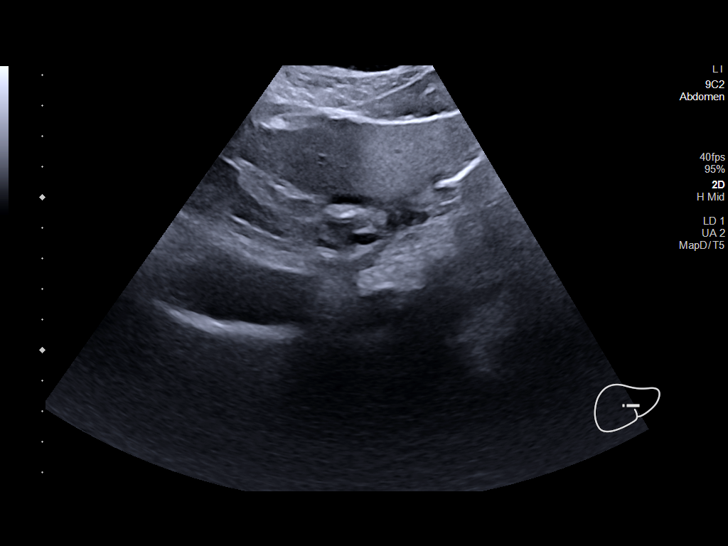
[im 22/85]
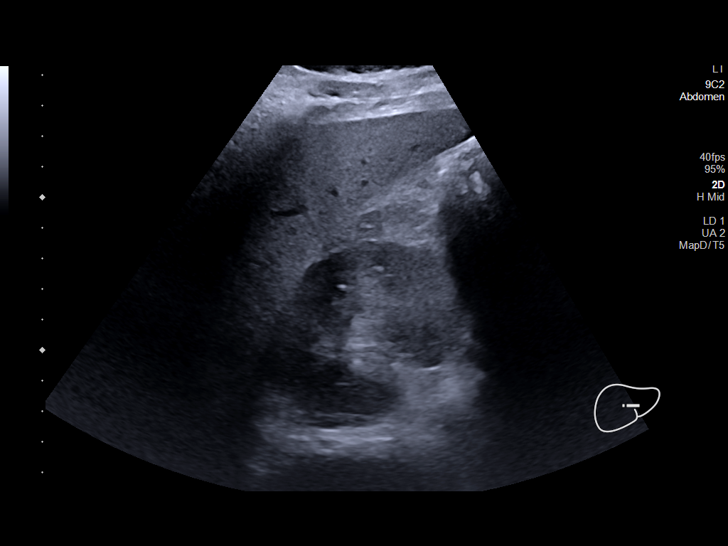
[im 29/85]
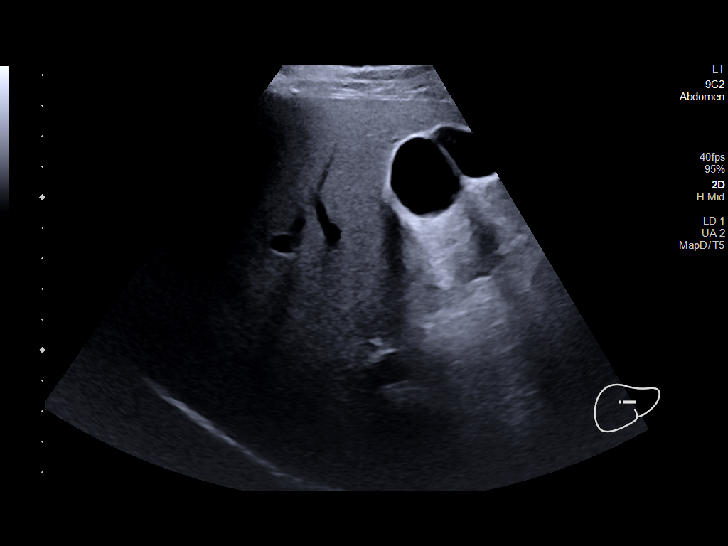
[im 32/85]
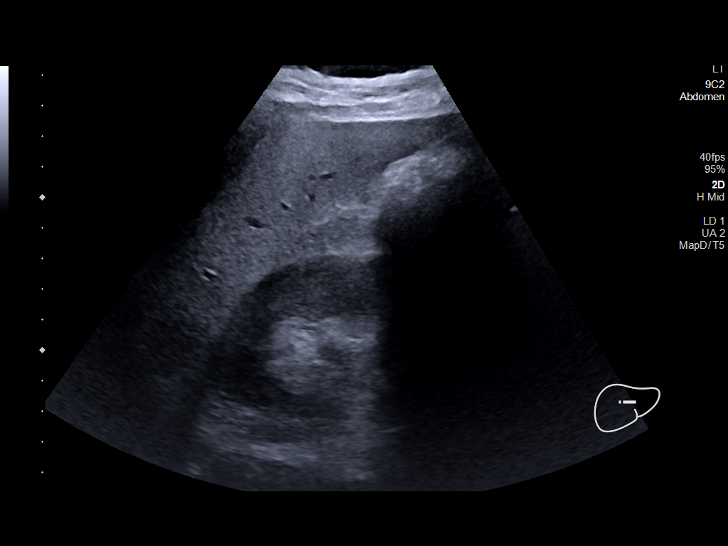
[im 39/85]
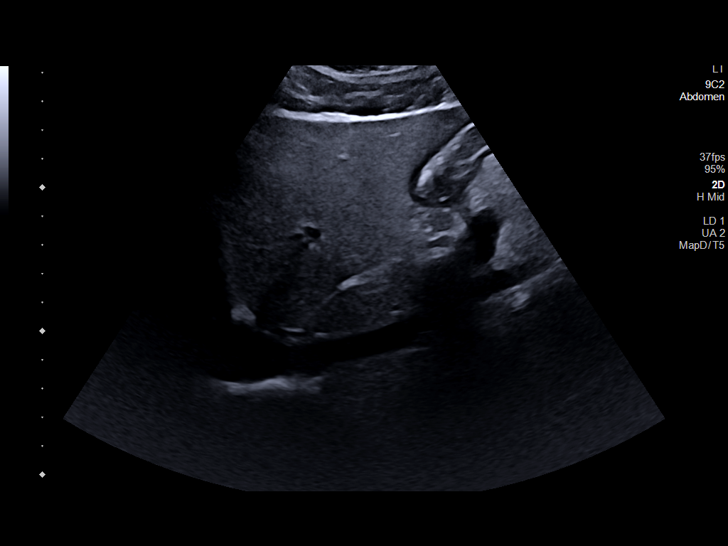
[im 46/85]
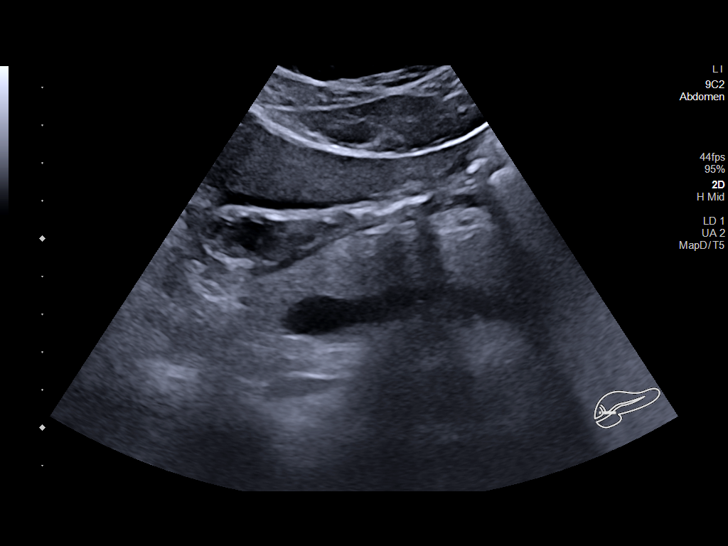
[im 53/85]
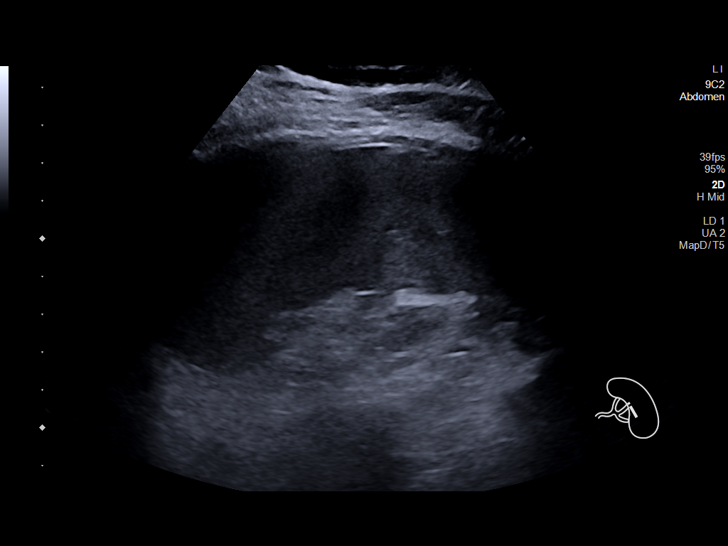
[im 57/85]
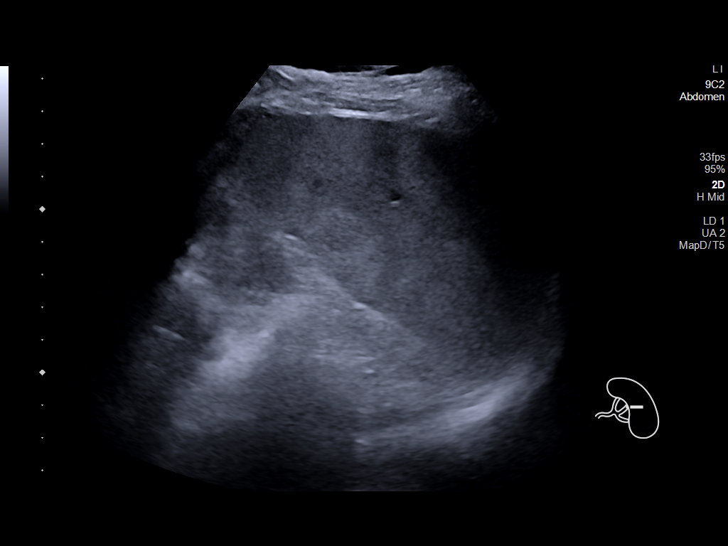
[im 64/85]
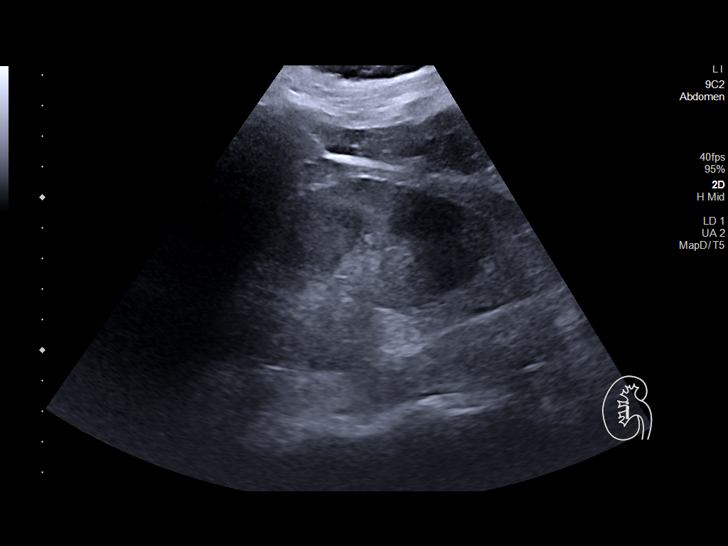
[im 71/85]
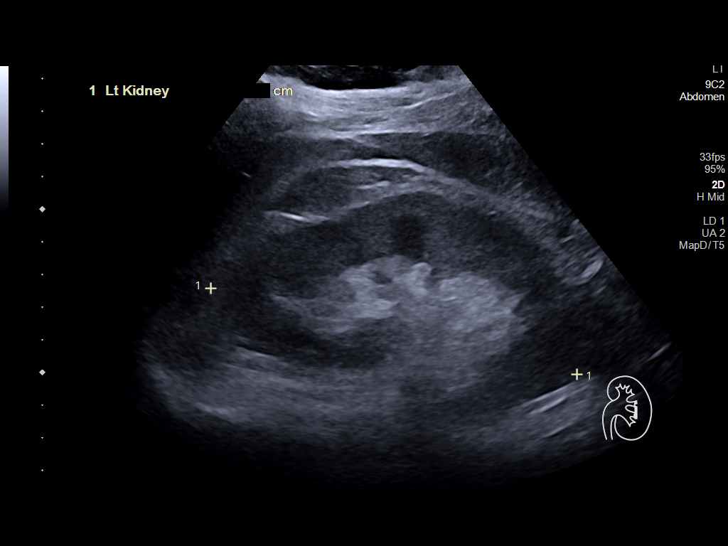
[im 78/85]
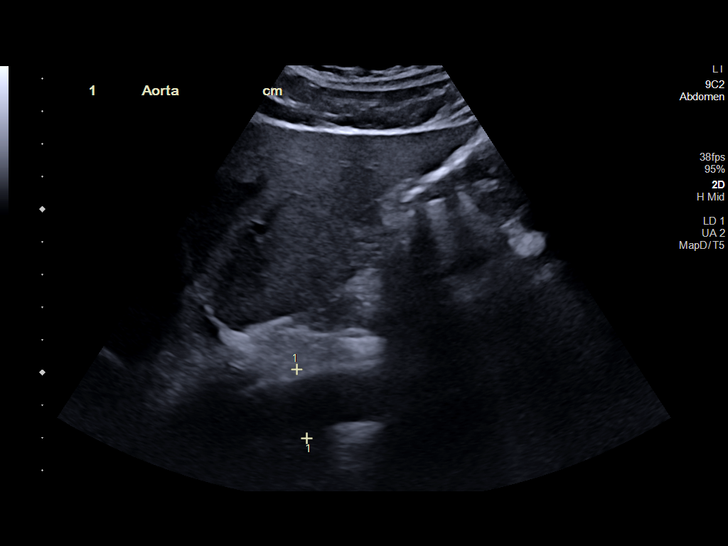
[im 85/85]
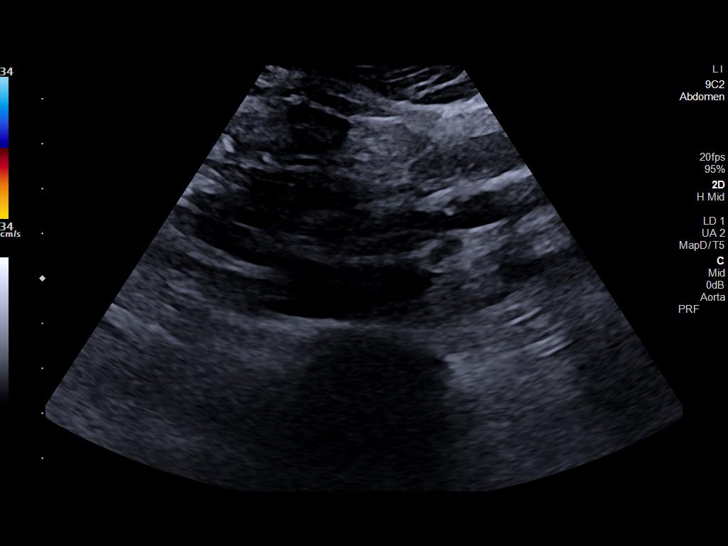

[14 of 25 positions shown; findings below may reference images not displayed]

FINDINGS: Gallbladder: No gallstones or wall thickening visualized. No
sonographic Murphy sign noted by sonographer.

Common bile duct: Diameter: 2 mm, normal.

Liver: Liver appears echogenic on image 47. No discrete liver
lesion. No intrahepatic biliary ductal dilatation. Portal vein is
patent on color Doppler imaging with normal direction of blood flow
towards the liver.

IVC: No abnormality visualized.

Pancreas: Visualized portion unremarkable.

Spleen: Size and appearance within normal limits.

Right Kidney: Length: 12.9 cm. Echogenicity within normal limits. No
mass or hydronephrosis visualized.

Left Kidney: Length: 11.5 cm. Echogenicity within normal limits. No
mass or hydronephrosis visualized.

Abdominal aorta: No aneurysm visualized.

Other findings: No free fluid.
IMPRESSION: 1. Evidence of hepatic steatosis.
2. But otherwise negative Ultrasound appearance of the abdomen.

## 2023-12-18 ENCOUNTER — Encounter: Payer: Self-pay | Admitting: Radiology

## 2023-12-19 ENCOUNTER — Telehealth: Payer: Self-pay | Admitting: Sports Medicine

## 2023-12-20 ENCOUNTER — Encounter: Payer: Self-pay | Admitting: Physician Assistant

## 2023-12-20 ENCOUNTER — Ambulatory Visit: Admitting: Physician Assistant

## 2023-12-20 ENCOUNTER — Other Ambulatory Visit: Payer: Self-pay

## 2023-12-20 ENCOUNTER — Ambulatory Visit: Admitting: Allergy

## 2023-12-20 VITALS — HR 78 | Temp 97.9°F | Resp 18 | Ht 66.0 in | Wt 192.3 lb

## 2023-12-20 DIAGNOSIS — J302 Other seasonal allergic rhinitis: Secondary | ICD-10-CM

## 2023-12-20 DIAGNOSIS — R0602 Shortness of breath: Secondary | ICD-10-CM | POA: Diagnosis not present

## 2023-12-20 DIAGNOSIS — H1013 Acute atopic conjunctivitis, bilateral: Secondary | ICD-10-CM | POA: Diagnosis not present

## 2023-12-20 DIAGNOSIS — J3089 Other allergic rhinitis: Secondary | ICD-10-CM | POA: Diagnosis not present

## 2023-12-20 MED ORDER — LEVOCETIRIZINE DIHYDROCHLORIDE 5 MG PO TABS: 5.0000 mg | ORAL_TABLET | Freq: Every evening | ORAL | 5 refills | Status: DC

## 2023-12-20 MED ORDER — CROMOLYN SODIUM 4 % OP SOLN: 1.0000 [drp] | Freq: Four times a day (QID) | OPHTHALMIC | 5 refills | Status: DC | PRN

## 2023-12-20 NOTE — Patient Instructions (Addendum)
 Rhinitis with conjunctivitis, allergic - Continue avoidance measures for grasses, ragweed, weeds, trees, outdoor molds, dust mites, cat, and cockroach - Consider allergy  shots as a means of long-term control if medication management above is not effective enough. - Allergy  shots re-train and reset the immune system to ignore environmental allergens and decrease the resulting immune response to those allergens (sneezing, itchy watery eyes, runny nose, nasal congestion, etc).    - Allergy  shots improve symptoms in 75-85% of patients.  - We can discuss more at a future appointment if the medications are not working for you.  - Recommend the following allergy  regimen:  Xyzal  (levocetirizine) 5mg  tablet once daily.  This is an long-acting antihistamine. You can use an extra dose of the antihistamine, if needed, for breakthrough symptoms.  Dymista  1 spray each nostril twice a day as needed for runny or stuffy nose.  With using nasal sprays point tip of bottle toward eye on same side nostril and lean head slightly forward for best technique.   Cromolyn  1 drop each eye up to 4 times a day as needed for itchy/watery eyes.  - You can use an extra dose of the antihistamine, if needed, for breakthrough symptoms.  - Consider nasal saline rinses 1-2 times daily to remove allergens from the nasal cavities as well as help with mucous clearance (this is especially helpful to do before the nasal sprays are given)  Shortness of breath - Lung function testing today is normal - Have access to albuterol  inhaler 2 puffs every 4-6 hours as needed for cough/wheeze/shortness of breath/chest tightness.  May use 15-20 minutes prior to activity.   Monitor frequency of use.      Routine follow-up in 3-4 months or sooner if needed
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
## 2023-12-20 NOTE — Progress Notes (Unsigned)
 Follow-up Note  RE: Sean Ingram MRN: [id number] DOB: [birth year] Date of Office Visit: 12/20/2023   History of present illness: Sean Ingram is a 33 y.o. male presenting today for follow-up of allergic rhinitis with conjunctivitis.  He was last seen in the office on 06/16/23 by myself.  Discussed the use of AI scribe software for clinical note transcription with the patient, who gave verbal consent to proceed.  History of Present Illness   Sean Ingram is a 33 year old male who presents with shortness of breath exacerbated by recent illness.  He has experienced shortness of breath for an extended period, which worsens in enclosed spaces such as a closed room or car. Relief is found by being near an open window and keeping his mouth open to breathe. He describes a sensation of something 'stuck' in his throat, which he attempts to alleviate by drinking water.  His shortness of breath worsened following a recent illness a few weeks ago, which he attributes to catching a cold from his child who was sick and coughing. During this time, he experienced increased coughing and shortness of breath.  He has been using a nasal spray called Dymista , which has improved his symptoms related to allergies. He uses the spray twice a day, one spray in each nostril. He also uses eye drops when his eyes are itchy or watery.  He works as an Biomedical scientist and reports that exposure to animals like cats and dogs during rides triggers sneezing, indicating possible allergies.      Review of systems: 10pt ROS negative unless noted above in HPI   Past medical/social/surgical/family history have been reviewed and are unchanged unless specifically indicated below.  No changes  Medication List: Current Outpatient Medications  Medication Sig Dispense Refill   Azelastine -Fluticasone  (DYMISTA ) 137-50 MCG/ACT SUSP Place 1 spray into the nose 2 (two) times daily as needed (Runny or stuffy nose). 23 g 5    cromolyn  (OPTICROM ) 4 % ophthalmic solution Place 1 drop into both eyes 4 (four) times daily as needed (Itchy watery eyes). 10 mL 5   fluconazole  (DIFLUCAN ) 150 MG tablet Take 1 tablet (150 mg total) by mouth once a week. 12 tablet 0   levocetirizine (XYZAL ) 5 MG tablet Take 1 tablet (5 mg total) by mouth every evening. 30 tablet 5   meloxicam  (MOBIC ) 15 MG tablet Take 1 tablet (15 mg total) by mouth daily. 30 tablet 0   cetirizine  (ZYRTEC  ALLERGY ) 10 MG tablet Take 1 tablet (10 mg total) by mouth daily. (Patient not taking: Reported on 06/07/2023) 30 tablet 2   fluticasone  (FLONASE ) 50 MCG/ACT nasal spray Place 2 sprays into both nostrils daily. (Patient not taking: Reported on 06/07/2023) 9.9 mL 2   ibuprofen  (ADVIL ) 800 MG tablet Take 1 tablet (800 mg total) by mouth every 8 (eight) hours as needed (pain). (Patient not taking: Reported on 06/07/2023) 21 tablet 0   methocarbamol  (ROBAXIN ) 500 MG tablet Take 1 tablet (500 mg total) by mouth every 8 (eight) hours as needed for muscle spasms. (Patient not taking: Reported on 06/07/2023) 30 tablet 0   methylPREDNISolone  (MEDROL  DOSEPAK) 4 MG TBPK tablet Take per packet instructions. Taper dosing. (Patient not taking: Reported on 06/07/2023) 1 each 0   olopatadine  (PATANOL) 0.1 % ophthalmic solution Place 1 drop into both eyes 2 (two) times daily. (Patient not taking: Reported on 06/07/2023) 5 mL 2   No current facility-administered medications for this visit.     Known  medication allergies: No Known Allergies   Physical examination: Pulse 78, temperature 97.9 F (36.6 C), temperature source Temporal, resp. rate 18, height 5' 6 (1.676 m), weight 192 lb 4.8 oz (87.2 kg), SpO2 99%.  General: Alert, interactive, in no acute distress. HEENT: PERRLA, TMs pearly gray, turbinates moderately edematous without discharge, post-pharynx non erythematous. Neck: Supple without lymphadenopathy. Lungs: Clear to auscultation without wheezing, rhonchi or rales.  {no increased work of breathing. CV: Normal S1, S2 without murmurs. Abdomen: Nondistended, nontender. Skin: Warm and dry, without lesions or rashes. Extremities:  No clubbing, cyanosis or edema. Neuro:   Grossly intact.  Diagnostics/Labs:  Spirometry: FEV1: 3.07L 86%, FVC: 3.58L 83%, ratio consistent with nonobstructive pattern  Assessment and plan: Patient Instructions  Rhinitis with conjunctivitis, allergic - Continue avoidance measures for grasses, ragweed, weeds, trees, outdoor molds, dust mites, cat, and cockroach - Consider allergy  shots as a means of long-term control if medication management above is not effective enough. - Allergy  shots re-train and reset the immune system to ignore environmental allergens and decrease the resulting immune response to those allergens (sneezing, itchy watery eyes, runny nose, nasal congestion, etc).    - Allergy  shots improve symptoms in 75-85% of patients.  - We can discuss more at a future appointment if the medications are not working for you.  - Recommend the following allergy  regimen:  Xyzal  (levocetirizine) 5mg  tablet once daily.  This is an long-acting antihistamine. You can use an extra dose of the antihistamine, if needed, for breakthrough symptoms.  Dymista  1 spray each nostril twice a day as needed for runny or stuffy nose.  With using nasal sprays point tip of bottle toward eye on same side nostril and lean head slightly forward for best technique.   Cromolyn  1 drop each eye up to 4 times a day as needed for itchy/watery eyes.  - You can use an extra dose of the antihistamine, if needed, for breakthrough symptoms.  - Consider nasal saline rinses 1-2 times daily to remove allergens from the nasal cavities as well as help with mucous clearance (this is especially helpful to do before the nasal sprays are given)  Shortness of breath - Lung function testing today is normal - Have access to albuterol  inhaler 2 puffs every 4-6 hours as  needed for cough/wheeze/shortness of breath/chest tightness.  May use 15-20 minutes prior to activity.   Monitor frequency of use.      Routine follow-up in 3-4 months or sooner if needed  No follow-ups on file.  I appreciate the opportunity to take part in Aloys's care. Please do not hesitate to contact me with questions.  Sincerely,   Danita Brain, MD Allergy /Immunology [facility]

## 2023-12-21 ENCOUNTER — Telehealth: Payer: Self-pay | Admitting: Allergy

## 2023-12-21 NOTE — Telephone Encounter (Signed)
 Sean Ingram called and stated was to have been prescribed an albuterol  inhaler at his appt yesterday, and it was not sent in. He uses the [facility] on [address] in [place]

## 2023-12-22 ENCOUNTER — Encounter: Payer: Self-pay | Admitting: Allergy

## 2023-12-25 MED ORDER — ALBUTEROL SULFATE HFA 108 (90 BASE) MCG/ACT IN AERS: 2.0000 | INHALATION_SPRAY | RESPIRATORY_TRACT | 1 refills | Status: DC | PRN

## 2023-12-25 NOTE — Telephone Encounter (Signed)
 Albuterol  sent in. I called the patient and left a message to call the office back to inform.

## 2023-12-25 NOTE — Addendum Note (Signed)
 Addended by: MENDEZ-MUNGARAY, Tameya Kuznia M on: 12/25/2023 03:41 PM   Modules accepted: Orders

## 2024-03-21 ENCOUNTER — Ambulatory Visit: Admitting: Allergy

## 2024-03-21 ENCOUNTER — Other Ambulatory Visit: Payer: Self-pay

## 2024-03-21 ENCOUNTER — Encounter: Payer: Self-pay | Admitting: Allergy

## 2024-03-21 VITALS — BP 98/72 | HR 68 | Temp 98.9°F | Wt 179.4 lb

## 2024-03-21 DIAGNOSIS — J302 Other seasonal allergic rhinitis: Secondary | ICD-10-CM

## 2024-03-21 DIAGNOSIS — R0602 Shortness of breath: Secondary | ICD-10-CM

## 2024-03-21 DIAGNOSIS — J3089 Other allergic rhinitis: Secondary | ICD-10-CM

## 2024-03-21 DIAGNOSIS — H1013 Acute atopic conjunctivitis, bilateral: Secondary | ICD-10-CM

## 2024-03-21 MED ORDER — CROMOLYN SODIUM 4 % OP SOLN: 1.0000 [drp] | Freq: Four times a day (QID) | OPHTHALMIC | 5 refills | Status: AC | PRN

## 2024-03-21 MED ORDER — ALBUTEROL SULFATE HFA 108 (90 BASE) MCG/ACT IN AERS: 2.0000 | INHALATION_SPRAY | RESPIRATORY_TRACT | 1 refills | Status: AC | PRN

## 2024-03-21 MED ORDER — LEVOCETIRIZINE DIHYDROCHLORIDE 5 MG PO TABS: 5.0000 mg | ORAL_TABLET | Freq: Every evening | ORAL | 5 refills | Status: AC

## 2024-03-21 MED ORDER — AZELASTINE-FLUTICASONE 137-50 MCG/ACT NA SUSP: 1.0000 | Freq: Two times a day (BID) | NASAL | 5 refills | Status: AC | PRN

## 2024-03-21 MED ORDER — MONTELUKAST SODIUM 10 MG PO TABS: 10.0000 mg | ORAL_TABLET | Freq: Every day | ORAL | 5 refills | Status: AC

## 2024-03-21 NOTE — Progress Notes (Unsigned)
 "   Follow-up Note  RE: Sean Ingram MRN: [id number] DOB: 05-04-1990 Date of Office Visit: 03/21/2024   History of present illness: Sean Ingram is a 34 y.o. male presenting today for follow-up of allergic rhinitis with conjunctivitis, shortness of breath.  He was last seen in the office on 12/20/2023 by myself. Discussed the use of AI scribe software for clinical note transcription with the patient, who gave verbal consent to proceed.  His allergy  symptoms have improved with the current medication regimen, but he continues to experience redness in his eyes. The eye drops do not alleviate the redness, although they help with itchiness and watering. He describes a burning sensation in his eyes, which occurs even when he gets enough sleep. He sometimes uses two drops per eye of the cromolyn  at a time.  He uses Dymista  nasal spray, which he has run out of and requires a refill. He uses the spray twice daily as directed. He also takes Xyzal  daily for allergy  management.  Regarding his breathing, he uses an albuterol  inhaler primarily before exercise and as needed for shortness of breath, which occurs about twice a week. During these episodes, he uses the inhaler every two hours until symptoms subside.      Review of systems: 10pt  ROS negative unless noted above in HPI  Past medical/social/surgical/family history have been reviewed and are unchanged unless specifically indicated below.  No changes  Medication List: Current Outpatient Medications  Medication Sig Dispense Refill   ibuprofen  (ADVIL ) 800 MG tablet Take 1 tablet (800 mg total) by mouth every 8 (eight) hours as needed (pain). 21 tablet 0   montelukast  (SINGULAIR ) 10 MG tablet Take 1 tablet (10 mg total) by mouth at bedtime. 30 tablet 5   albuterol  (VENTOLIN  HFA) 108 (90 Base) MCG/ACT inhaler Inhale 2 puffs into the lungs every 4 (four) hours as needed for wheezing or shortness of breath. 18 g 1   Azelastine -Fluticasone   (DYMISTA ) 137-50 MCG/ACT SUSP Place 1 spray into the nose 2 (two) times daily as needed (Runny or stuffy nose). 23 g 5   cetirizine  (ZYRTEC  ALLERGY ) 10 MG tablet Take 1 tablet (10 mg total) by mouth daily. (Patient not taking: Reported on 03/21/2024) 30 tablet 2   cromolyn  (OPTICROM ) 4 % ophthalmic solution Place 1 drop into both eyes 4 (four) times daily as needed (Itchy watery eyes). 10 mL 5   fluconazole  (DIFLUCAN ) 150 MG tablet Take 1 tablet (150 mg total) by mouth once a week. (Patient not taking: Reported on 03/21/2024) 12 tablet 0   fluticasone  (FLONASE ) 50 MCG/ACT nasal spray Place 2 sprays into both nostrils daily. (Patient not taking: Reported on 03/21/2024) 9.9 mL 2   levocetirizine (XYZAL ) 5 MG tablet Take 1 tablet (5 mg total) by mouth every evening. 30 tablet 5   meloxicam  (MOBIC ) 15 MG tablet Take 1 tablet (15 mg total) by mouth daily. (Patient not taking: Reported on 03/21/2024) 30 tablet 0   methocarbamol  (ROBAXIN ) 500 MG tablet Take 1 tablet (500 mg total) by mouth every 8 (eight) hours as needed for muscle spasms. (Patient not taking: Reported on 03/21/2024) 30 tablet 0   methylPREDNISolone  (MEDROL  DOSEPAK) 4 MG TBPK tablet Take per packet instructions. Taper dosing. (Patient not taking: Reported on 06/07/2023) 1 each 0   olopatadine  (PATANOL) 0.1 % ophthalmic solution Place 1 drop into both eyes 2 (two) times daily. (Patient not taking: Reported on 03/21/2024) 5 mL 2   No current facility-administered medications for this visit.  Known medication allergies: Allergies[1]   Physical examination: Blood pressure 98/72, pulse 68, temperature 98.9 F (37.2 C), temperature source Temporal, weight 179 lb 6.4 oz (81.4 kg), SpO2 98%.  General: Alert, interactive, in no acute distress. HEENT: PERRLA, TMs pearly gray, turbinates non-edematous without discharge, post-pharynx non erythematous. Neck: Supple without lymphadenopathy. Lungs: Clear to auscultation without wheezing, rhonchi or rales.  {no increased work of breathing. CV: Normal S1, S2 without murmurs. Abdomen: Nondistended, nontender. Skin: Warm and dry, without lesions or rashes. Extremities:  No clubbing, cyanosis or edema. Neuro:   Grossly intact.  Diagnostics/Labs: None today  Assessment and plan:   Rhinitis with conjunctivitis, allergic - Continue avoidance measures for grasses, ragweed, weeds, trees, outdoor molds, dust mites, cat, and cockroach - Consider allergy  shots as a means of long-term control if medication management above is not effective enough. - Allergy  shots re-train and reset the immune system to ignore environmental allergens and decrease the resulting immune response to those allergens (sneezing, itchy watery eyes, runny nose, nasal congestion, etc).    - Allergy  shots improve symptoms in 75-85% of patients.  - We can discuss more at a future appointment if the medications are not working for you.  - Recommend the following allergy  regimen:  Xyzal  (levocetirizine) 5mg  tablet once daily.  This is an long-acting antihistamine. You can use an extra dose of the antihistamine, if needed, for breakthrough symptoms.  Dymista  1 spray each nostril twice a day as needed for runny or stuffy nose.  With using nasal sprays point tip of bottle toward eye on same side nostril and lean head slightly forward for best technique.   Cromolyn  1 drop each eye up to 4 times a day as needed for itchy/watery eyes.  - You can use an extra dose of the antihistamine, if needed, for breakthrough symptoms.  - Consider nasal saline rinses 1-2 times daily to remove allergens from the nasal cavities as well as help with mucous clearance (this is especially helpful to do before the nasal sprays are given) - For red eyes can use Naphcon-A over-the-counter for as needed  Shortness of breath - Have access to albuterol  inhaler 2 puffs every 4-6 hours as needed for cough/wheeze/shortness of breath/chest tightness.  May use 15-20  minutes prior to activity.   Monitor frequency of use.   - Start Singulair  10mg  daily at bedtime. This is an antileukotriene that can help with both allergy  and asthma symptom control.  If you notice any change in mood/behavior/sleep after starting Singulair  then stop this medication and let us  know.  Symptoms resolve after stopping the medication.    Routine follow-up in 6 months or sooner if needed   I appreciate the opportunity to take part in Rashaad's care. Please do not hesitate to contact me with questions.  Sincerely,   Danita Brain, MD Allergy /Immunology [facility]      [1] No Known Allergies  "

## 2024-03-21 NOTE — Patient Instructions (Addendum)
 Rhinitis with conjunctivitis, allergic - Continue avoidance measures for grasses, ragweed, weeds, trees, outdoor molds, dust mites, cat, and cockroach - Consider allergy  shots as a means of long-term control if medication management above is not effective enough. - Allergy  shots re-train and reset the immune system to ignore environmental allergens and decrease the resulting immune response to those allergens (sneezing, itchy watery eyes, runny nose, nasal congestion, etc).    - Allergy  shots improve symptoms in 75-85% of patients.  - We can discuss more at a future appointment if the medications are not working for you.  - Recommend the following allergy  regimen:  Xyzal  (levocetirizine) 5mg  tablet once daily.  This is an long-acting antihistamine. You can use an extra dose of the antihistamine, if needed, for breakthrough symptoms.  Dymista  1 spray each nostril twice a day as needed for runny or stuffy nose.  With using nasal sprays point tip of bottle toward eye on same side nostril and lean head slightly forward for best technique.   Cromolyn  1 drop each eye up to 4 times a day as needed for itchy/watery eyes.  - You can use an extra dose of the antihistamine, if needed, for breakthrough symptoms.  - Consider nasal saline rinses 1-2 times daily to remove allergens from the nasal cavities as well as help with mucous clearance (this is especially helpful to do before the nasal sprays are given)  Shortness of breath - Have access to albuterol  inhaler 2 puffs every 4-6 hours as needed for cough/wheeze/shortness of breath/chest tightness.  May use 15-20 minutes prior to activity.   Monitor frequency of use.   - Start Singulair  10mg  daily at bedtime. This is an antileukotriene that can help with both allergy  and asthma symptom control.  If you notice any change in mood/behavior/sleep after starting Singulair  then stop this medication and let us  know.  Symptoms resolve after stopping the medication.    Red eyes - You can get Naphcon-A over-the-counter for as needed use for red eyes.    Routine follow-up in 6 months or sooner if needed

## 2024-09-18 ENCOUNTER — Ambulatory Visit: Admitting: Allergy
